# Patient Record
Sex: Male | Born: 1935 | Race: Black or African American | Hispanic: No | Smoking: Never smoker
Health system: Southern US, Community
[De-identification: ages and names within clinical notes are randomized; demographics above are authoritative.]

## PROBLEM LIST (undated history)

## (undated) DIAGNOSIS — H9190 Unspecified hearing loss, unspecified ear: Secondary | ICD-10-CM

## (undated) DIAGNOSIS — D509 Iron deficiency anemia, unspecified: Secondary | ICD-10-CM

## (undated) DIAGNOSIS — H5440 Blindness, one eye, unspecified eye: Secondary | ICD-10-CM

## (undated) DIAGNOSIS — H547 Unspecified visual loss: Secondary | ICD-10-CM

## (undated) HISTORY — PX: APPENDECTOMY: SHX54

## (undated) HISTORY — DX: Unspecified visual loss: H54.7

## (undated) HISTORY — PX: CATARACT EXTRACTION: SUR2

## (undated) HISTORY — DX: Iron deficiency anemia, unspecified: D50.9

## (undated) HISTORY — DX: Blindness, one eye, unspecified eye: H54.40

---

## 2001-02-16 ENCOUNTER — Encounter: Payer: Self-pay | Admitting: Urology

## 2001-02-17 ENCOUNTER — Inpatient Hospital Stay (HOSPITAL_COMMUNITY): Admission: RE | Admit: 2001-02-17 | Discharge: 2001-02-19 | Payer: Self-pay | Admitting: Urology

## 2001-02-17 ENCOUNTER — Encounter (INDEPENDENT_AMBULATORY_CARE_PROVIDER_SITE_OTHER): Payer: Self-pay

## 2002-02-05 ENCOUNTER — Encounter: Payer: Self-pay | Admitting: *Deleted

## 2002-02-05 ENCOUNTER — Inpatient Hospital Stay (HOSPITAL_COMMUNITY): Admission: EM | Admit: 2002-02-05 | Discharge: 2002-02-11 | Payer: Self-pay | Admitting: *Deleted

## 2002-02-05 ENCOUNTER — Encounter: Payer: Self-pay | Admitting: Internal Medicine

## 2002-02-07 ENCOUNTER — Encounter: Payer: Self-pay | Admitting: Internal Medicine

## 2002-02-10 ENCOUNTER — Encounter: Payer: Self-pay | Admitting: Internal Medicine

## 2003-10-31 ENCOUNTER — Ambulatory Visit (HOSPITAL_COMMUNITY): Admission: RE | Admit: 2003-10-31 | Discharge: 2003-10-31 | Payer: Self-pay | Admitting: Internal Medicine

## 2004-01-31 ENCOUNTER — Ambulatory Visit: Payer: Self-pay | Admitting: Internal Medicine

## 2004-01-31 ENCOUNTER — Inpatient Hospital Stay (HOSPITAL_COMMUNITY): Admission: EM | Admit: 2004-01-31 | Discharge: 2004-02-04 | Payer: Self-pay | Admitting: Emergency Medicine

## 2004-03-16 ENCOUNTER — Ambulatory Visit: Payer: Self-pay | Admitting: Internal Medicine

## 2005-05-27 ENCOUNTER — Ambulatory Visit: Payer: Self-pay | Admitting: Internal Medicine

## 2005-05-27 ENCOUNTER — Inpatient Hospital Stay (HOSPITAL_COMMUNITY): Admission: EM | Admit: 2005-05-27 | Discharge: 2005-06-05 | Payer: Self-pay | Admitting: Emergency Medicine

## 2005-06-17 ENCOUNTER — Ambulatory Visit: Payer: Self-pay | Admitting: Internal Medicine

## 2005-07-05 ENCOUNTER — Ambulatory Visit: Payer: Self-pay | Admitting: Internal Medicine

## 2005-07-29 ENCOUNTER — Encounter: Payer: Self-pay | Admitting: Vascular Surgery

## 2005-07-29 ENCOUNTER — Ambulatory Visit: Payer: Self-pay | Admitting: Endocrinology

## 2005-07-29 ENCOUNTER — Ambulatory Visit (HOSPITAL_COMMUNITY): Admission: RE | Admit: 2005-07-29 | Discharge: 2005-07-29 | Payer: Self-pay | Admitting: Endocrinology

## 2005-08-01 ENCOUNTER — Ambulatory Visit: Payer: Self-pay | Admitting: Endocrinology

## 2005-08-06 ENCOUNTER — Ambulatory Visit (HOSPITAL_COMMUNITY): Admission: RE | Admit: 2005-08-06 | Discharge: 2005-08-06 | Payer: Self-pay | Admitting: Obstetrics and Gynecology

## 2005-08-06 ENCOUNTER — Ambulatory Visit: Payer: Self-pay | Admitting: Internal Medicine

## 2005-09-10 ENCOUNTER — Ambulatory Visit: Payer: Self-pay | Admitting: Internal Medicine

## 2005-10-28 ENCOUNTER — Ambulatory Visit: Payer: Self-pay | Admitting: Internal Medicine

## 2006-03-10 ENCOUNTER — Emergency Department (HOSPITAL_COMMUNITY): Admission: EM | Admit: 2006-03-10 | Discharge: 2006-03-11 | Payer: Self-pay | Admitting: Emergency Medicine

## 2006-11-26 ENCOUNTER — Encounter: Payer: Self-pay | Admitting: Endocrinology

## 2006-11-26 DIAGNOSIS — I10 Essential (primary) hypertension: Secondary | ICD-10-CM | POA: Insufficient documentation

## 2006-12-24 IMAGING — CR DG ABDOMEN 2V
2 series · 2 of 2 positions shown · non-contrast
Comparison: none

CLINICAL DATA: Abdominal pain

[view not recorded (1 of 2)]
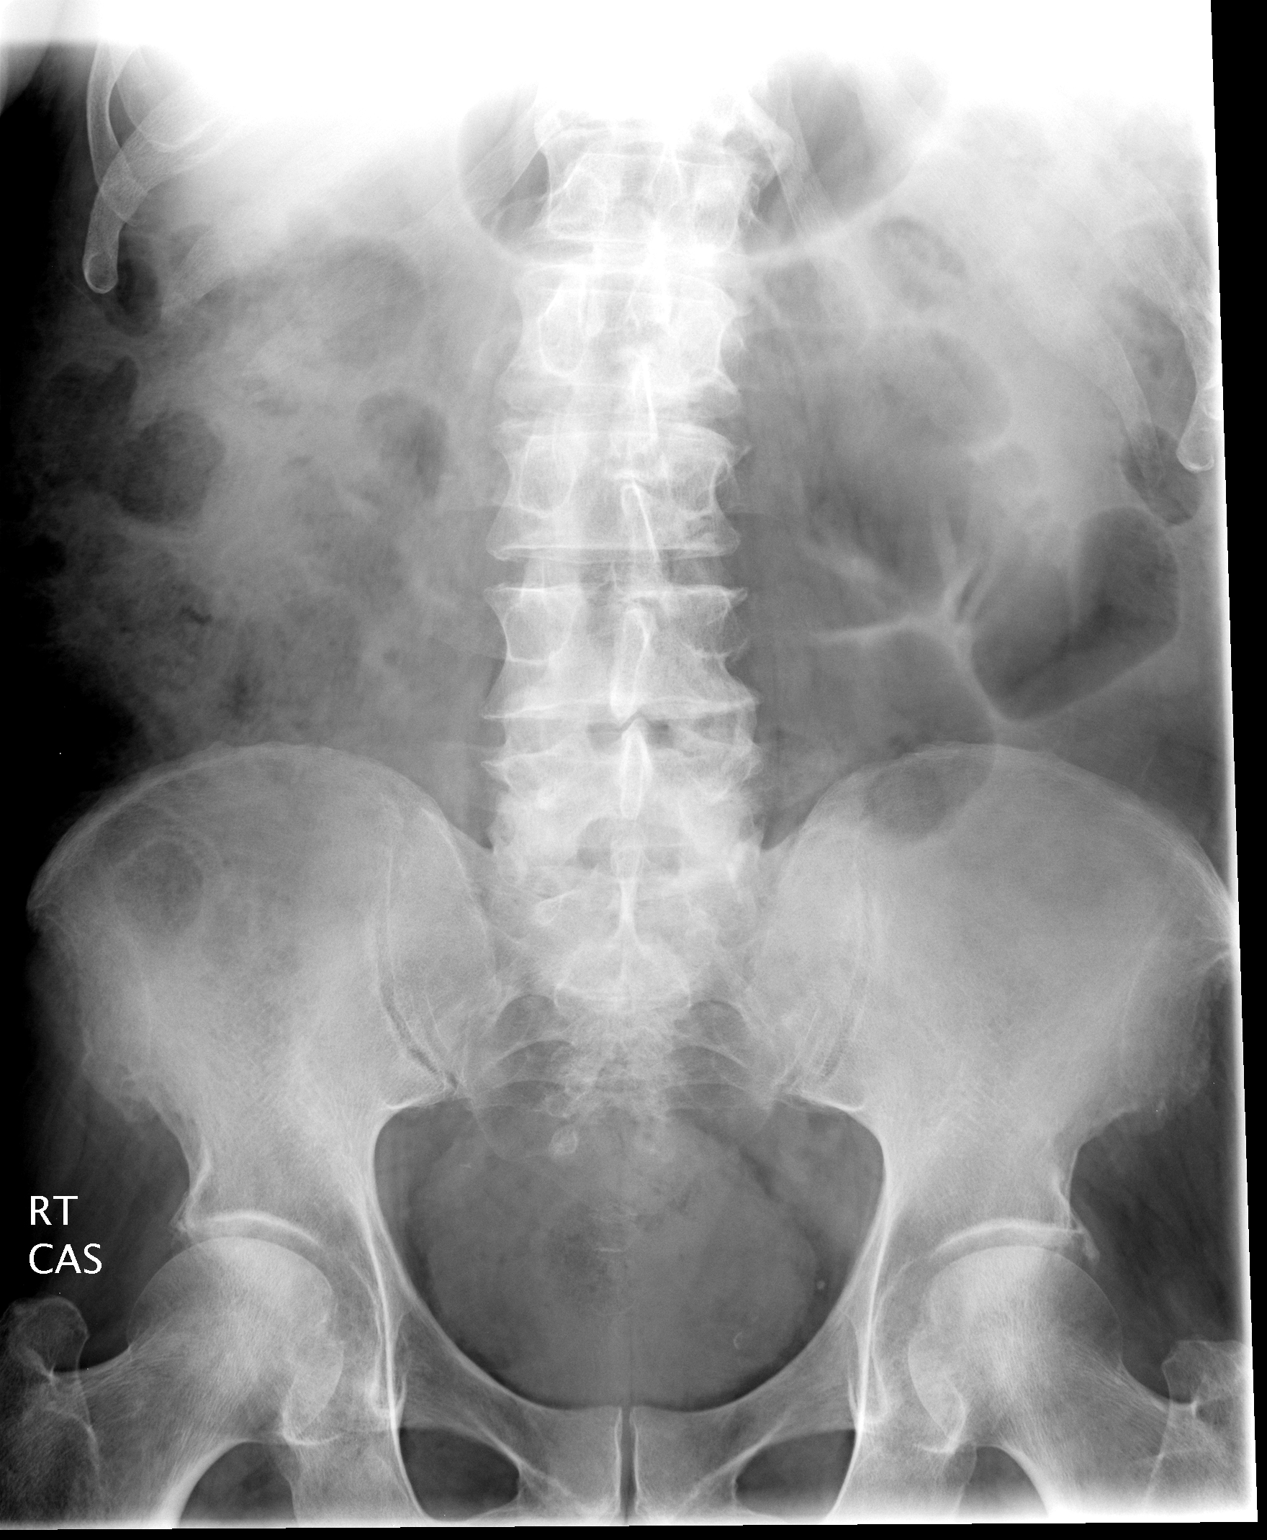

[view not recorded (2 of 2)]
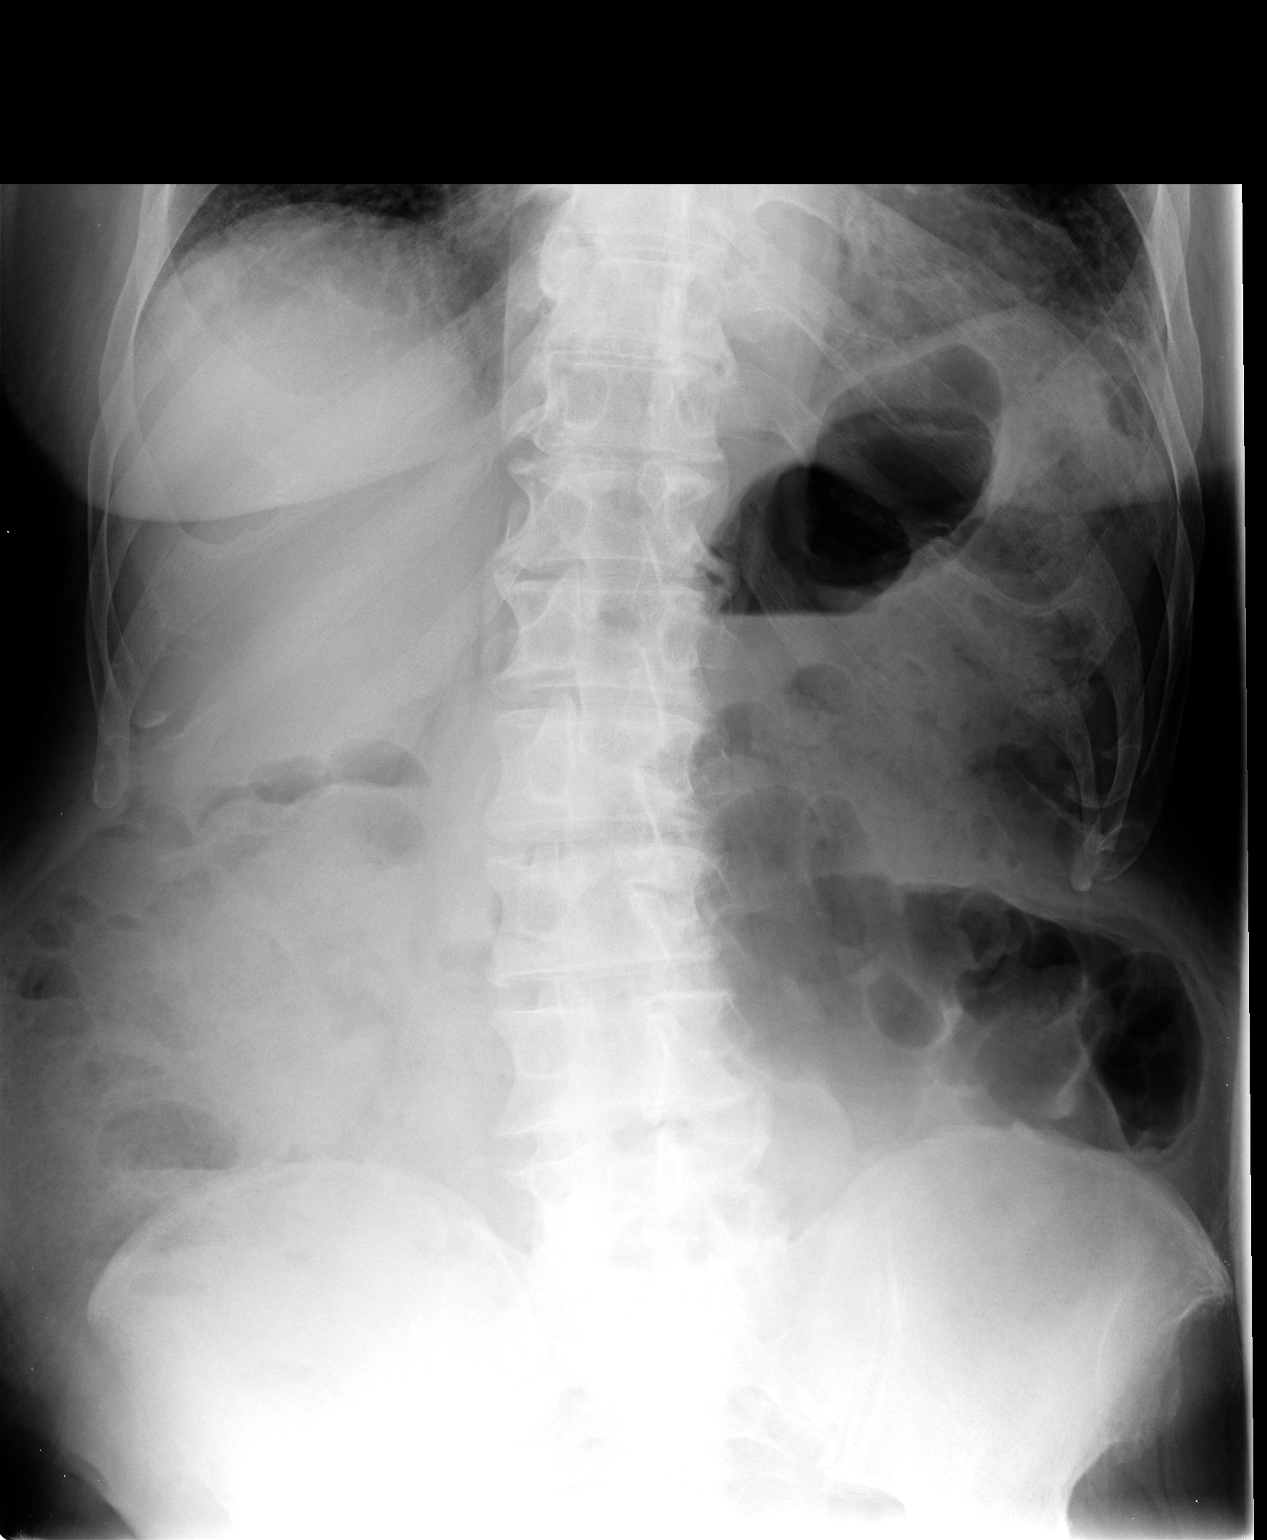

[2 of 2 positions shown; findings below may reference images not displayed]

Abdomen 2 view:

No free air. Small bowel decompressed. Moderate amount of fecal material in the
proximal colon which is nondilated. Degenerative spurring and mild
dextroscoliosis in the lumbar spine stable since films of 05/27/2005. Phleboliths
in the lower left pelvis. Degenerative spurring in both hips.
IMPRESSION: 1. Nonobstructed bowel gas pattern with moderate amount of fecal material in the
proximal colon.
2. No free air

## 2007-02-28 IMAGING — NM NM BONE 3 PHASE
1 series · 6 of 6 positions shown · non-contrast
Comparison: none

CLINICAL DATA: Left foot pain, diabetes mellitus. Thought to have osteomyelitis.
 NUCLEAR MEDICINE 3-PHASE BONE SCAN:
TECHNIQUE: Radionuclide angiographic, immediate static, and 3-hour delayed images were obtained after intravenous injection of radiopharmaceutical.
 Radiopharmaceutical:  20.9 mCi 5c-99m MDP.

[Series 1: fl flow and static · 8.46mm/px · 6 of 40 frames shown]
[frame 4/40]
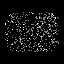
[frame 10/40]
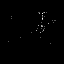
[frame 17/40]
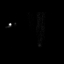
[frame 24/40]
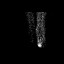
[frame 30/40]
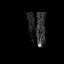
[frame 37/40]
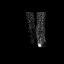

[6 of 6 positions shown; findings below may reference images not displayed]

FINDINGS: After the intravenous injection of 20.9 millicuries of technetium 44m-labeled MDP, rapid sequence flow study showed immediate increase in activity associated with the left lower leg and foot as compared to the right.  There is also intense activity seen immediately in the region of the left great toe.  
 Intermediate and delayed studies were made and again showed the intense activity associated with the left first toe and mild diffuse increased activity associated with the left lower leg and foot as compared to the right.  No significant increased activity is seen focally in the region of the first cuneiform where there was some concern on the x-ray for bony destruction or demineralization.  The bone detail on the delayed studies is not optimal presumably due to the patient?s lack of hydration and ambulation.
IMPRESSION: Probable osteomyelitis changes associated with the distal phalangeal area of the left first toe.  No significant additional activity is seen in the region of the first cuneiform bone of the left foot.
 Bony detail is poor on this study due to the patient?s circulatory problems, lack of ambulation, and poor hydration during the study.

## 2007-05-31 ENCOUNTER — Emergency Department (HOSPITAL_COMMUNITY): Admission: EM | Admit: 2007-05-31 | Discharge: 2007-05-31 | Payer: Self-pay | Admitting: Emergency Medicine

## 2008-01-04 ENCOUNTER — Ambulatory Visit: Payer: Self-pay | Admitting: Internal Medicine

## 2008-01-04 ENCOUNTER — Observation Stay (HOSPITAL_COMMUNITY): Admission: EM | Admit: 2008-01-04 | Discharge: 2008-01-08 | Payer: Self-pay | Admitting: Emergency Medicine

## 2008-01-04 ENCOUNTER — Ambulatory Visit: Payer: Self-pay | Admitting: Cardiovascular Disease

## 2008-01-05 ENCOUNTER — Encounter: Payer: Self-pay | Admitting: Internal Medicine

## 2008-01-15 ENCOUNTER — Ambulatory Visit: Payer: Self-pay | Admitting: Internal Medicine

## 2008-01-15 DIAGNOSIS — D509 Iron deficiency anemia, unspecified: Secondary | ICD-10-CM

## 2008-01-15 DIAGNOSIS — H409 Unspecified glaucoma: Secondary | ICD-10-CM | POA: Insufficient documentation

## 2008-01-15 DIAGNOSIS — H5440 Blindness, one eye, unspecified eye: Secondary | ICD-10-CM

## 2008-01-15 DIAGNOSIS — M79609 Pain in unspecified limb: Secondary | ICD-10-CM | POA: Insufficient documentation

## 2008-01-15 DIAGNOSIS — H547 Unspecified visual loss: Secondary | ICD-10-CM

## 2008-01-15 DIAGNOSIS — N390 Urinary tract infection, site not specified: Secondary | ICD-10-CM

## 2008-01-22 ENCOUNTER — Telehealth: Payer: Self-pay | Admitting: Internal Medicine

## 2008-01-26 ENCOUNTER — Encounter: Payer: Self-pay | Admitting: Internal Medicine

## 2008-01-28 ENCOUNTER — Encounter: Payer: Self-pay | Admitting: Internal Medicine

## 2008-02-11 ENCOUNTER — Encounter: Payer: Self-pay | Admitting: Internal Medicine

## 2008-02-22 ENCOUNTER — Ambulatory Visit: Payer: Self-pay | Admitting: Internal Medicine

## 2008-03-01 ENCOUNTER — Encounter: Payer: Self-pay | Admitting: Internal Medicine

## 2009-02-14 ENCOUNTER — Ambulatory Visit: Payer: Self-pay | Admitting: Internal Medicine

## 2009-02-14 DIAGNOSIS — R21 Rash and other nonspecific skin eruption: Secondary | ICD-10-CM

## 2009-12-14 ENCOUNTER — Ambulatory Visit: Payer: Self-pay | Admitting: Internal Medicine

## 2010-01-02 ENCOUNTER — Telehealth: Payer: Self-pay | Admitting: Internal Medicine

## 2010-03-09 ENCOUNTER — Telehealth: Payer: Self-pay | Admitting: Internal Medicine

## 2010-03-12 ENCOUNTER — Encounter: Payer: Self-pay | Admitting: Internal Medicine

## 2010-04-26 NOTE — Assessment & Plan Note (Signed)
Summary: LEG PAIN /NWS   Vital Signs:  Patient profile:   77 year old male Height:      79 inches Weight:      163 pounds BMI:     18.43 O2 Sat:      98 % on Room air Temp:     97.9 degrees F oral Pulse rate:   64 / minute BP sitting:   132 / 84  (left arm) Cuff size:   regular  Vitals Entered By: Bill Salinas CMA (December 14, 2009 9:07 AM)  O2 Flow:  Room air CC: pt c/o leg pain (thigh area) x 1 month/ ab   Primary Care Provider:  Jacques Navy MD  CC:  pt c/o leg pain (thigh area) x 1 month/ ab.  History of Present Illness: [name] presents c/o pain in the dorsal aspect proximal left LE to touch. He states tha tif he touches his leg or holds the leg it will give him a sharp pain. He has some pain in the groin. He can stand, walk, laydown witout pain. He has no focal weakness or pareesthesia. He denies any injury. He has taken no medications for this.  Current Medications (verified): 1)  Xalatan 0.005 %  Soln (Latanoprost) .... Use 1 Drop in Each Eye Qd 2)  Timoptic Ocudose 0.5 %  Soln (Timolol Maleate) .... Use 1 Drop in Each Eye Qd 3)  Ferrous Sulfate 325 (65 Fe) Mg Tabs (Ferrous Sulfate) .... Take 1 Tablet By Mouth Two Times A Day 4)  Tylenol 325 Mg Tabs (Acetaminophen) .... As Needed 5)  Clotrimazole-Betamethasone 1-0.05 % Crea (Clotrimazole-Betamethasone) .... Apply To Affected Area On Back Two Times A Day.  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  Physical Exam  General:  elderly AA male in no distress Head:  normocephalic and atraumatic.  Ptosis right eye. Eyes:  C&S clear Neck:  supple.   Lungs:  normal respiratory effort and normal breath sounds.   Heart:  normal rate and regular rhythm.   Msk:  Left leg without deformity. No swelling at the knee. No mass left thigh. Tender to palpation along the quadraceps and c/o of sharp pain with palpation. No pain with flexion of the hip. Pulses:  2+ radial pulse Neurologic:  alert & oriented X3. Hyperesthesia proximal left LE.  Skin:  turgor normal and color normal.   Inguinal Nodes:  no L inguinal adenopathy.   Psych:  Oriented X3, memory intact for recent and remote, and normally interactive.     Impression & Recommendations:  Problem # 1:  LEG PAIN, LEFT (ICD-729.5) Patient's pain by character, location suggests superficial femoral nerve radiculopathy. He has no limitation in movement.  Plan - use of lineament, e.g. aspercreme           otc NSAID           if persistent discomfort will Rx gabapentin and a course of PT/massage therapy  Problem #  2:  HYPERTENSION (ICD-401.9)  BP today: 132/84 Prior BP: 138/62 (02/14/2009)  Normal BP without benefit of medication.  Complete Medication List: 1)  Xalatan 0.005 % Soln (Latanoprost) .... Use 1 drop in each eye qd 2)  Timoptic Ocudose 0.5 % Soln (Timolol maleate) .... Use 1 drop in each eye qd 3)  Ferrous Sulfate 325 (65 Fe) Mg Tabs (Ferrous sulfate) .... Take 1 tablet by mouth two times a day 4)  Tylenol 325 Mg Tabs (Acetaminophen) .... As needed 5)  Clotrimazole-betamethasone 1-0.05 % Crea (Clotrimazole-betamethasone) .... Apply to affected area on back two times a day.

## 2010-04-26 NOTE — Progress Notes (Signed)
Summary: LETTER NEEDED ASAP  Phone Note Call from Patient   Caller: [name] - [phone] Summary of Call: [name] is req a call regarding [facility] forms.  Initial call taken by: Lamar Sprinkles, CMA,  March 09, 2010 10:06 AM  Follow-up for Phone Call        Spoke w/Donna. She needs a letter from MD stating that pt does not have dx of memory loss or dementia.  Follow-up by: Lamar Sprinkles, CMA,  March 09, 2010 11:32 AM  Additional Follow-up for Phone Call Additional follow up Details #1::        Daughter called again, she has a time limit & wants to know status of letter.  Additional Follow-up by: Lamar Sprinkles, CMA,  March 12, 2010 12:35 PM    Additional Follow-up for Phone Call Additional follow up Details #2::    letter done. Follow-up by: Jacques Navy MD,  March 12, 2010 6:13 PM   Appended Document: LETTER NEEDED ASAP Pt's dtr informed to pick up letter

## 2010-04-26 NOTE — Progress Notes (Signed)
  Phone Note Call from Patient   Caller: [name] - Daughter [phone] Summary of Call: Pt's daughter is req a call back regarding [facility] paperwork.  Initial call taken by: Lamar Sprinkles, CMA,  January 02, 2010 11:36 AM  Follow-up for Phone Call        Spoke w/daughter, she wanted to ensure that we recieved the paperwork from [place]. Assured her that this was completed. She has concerns that MD does not know what kind of care patient really needs. I advised her that after [facility] makes decision and she feels pt should have more help to call our office - we will then attempt to do more depending on pt's needs.  Follow-up by: Lamar Sprinkles, CMA,  January 03, 2010 10:18 AM

## 2010-04-26 NOTE — Letter (Signed)
Summary: Generic Letter  [facility]  [address] [place], [place] [address]   Phone: [phone]  Fax: [phone]           03/12/2010  DURAN OHERN [address] [place], [place]  [address]  Dear Mr. CYGAN,  In reviewing you medical record and my office notes going back to 2009 I find no evidence of memory loss or mental incapacity of any kind. There has not been cause for doing a in-depth assessment in this area but no abnormal findings are noted in the record.  For any additional questions please feel free to contact me.   Sincerely,   Illene Regulus MD

## 2010-04-27 ENCOUNTER — Ambulatory Visit (INDEPENDENT_AMBULATORY_CARE_PROVIDER_SITE_OTHER): Payer: MEDICARE | Admitting: Internal Medicine

## 2010-04-27 ENCOUNTER — Encounter: Payer: Self-pay | Admitting: Internal Medicine

## 2010-04-27 ENCOUNTER — Telehealth: Payer: Self-pay | Admitting: Internal Medicine

## 2010-04-27 DIAGNOSIS — M26629 Arthralgia of temporomandibular joint, unspecified side: Secondary | ICD-10-CM

## 2010-04-27 DIAGNOSIS — J029 Acute pharyngitis, unspecified: Secondary | ICD-10-CM

## 2010-05-02 NOTE — Progress Notes (Signed)
Summary: OV TODAY  Phone Note Call from Patient   Caller: [name] - [phone] Summary of Call: Req apt for sore throat & swollen glands - Scheduled for office visit today Initial call taken by: Lamar Sprinkles, CMA,  April 27, 2010 10:27 AM

## 2010-05-10 NOTE — Assessment & Plan Note (Signed)
Summary: Sore throat and swollen gland/SD   Vital Signs:  Patient profile:   77 year old male Height:      79 inches Weight:      155 pounds BMI:     17.52 O2 Sat:      97 % on Room air Temp:     98.6 degrees F oral Pulse rate:   80 / minute BP sitting:   122 / 90  (left arm) Cuff size:   regular  Vitals Entered By: Bill Salinas CMA (April 27, 2010 4:33 PM)  O2 Flow:  Room air CC: pt here with c/o sore throat and pressure on left side of his face near his ear/ab   Primary Care Provider:  Jacques Navy MD  CC:  pt here with c/o sore throat and pressure on left side of his face near his ear/ab.  History of Present Illness: Mr. Ake present c/o severe pain in the left ear. He has had no drainage from the ear, no fever, no tinnitis, no increased loss of hearing. He also c/o sore throat more on the left than right. Again, no fever, no odynophagia. He has had no N/V. He is weak but this is chronic.  Current Medications (verified): 1)  Xalatan 0.005 %  Soln (Latanoprost) .... Use 1 Drop in Each Eye Qd 2)  Timoptic Ocudose 0.5 %  Soln (Timolol Maleate) .... Use 1 Drop in Each Eye Qd 3)  Ferrous Sulfate 325 (65 Fe) Mg Tabs (Ferrous Sulfate) .... Take 1 Tablet By Mouth Two Times A Day 4)  Tylenol 325 Mg Tabs (Acetaminophen) .... As Needed 5)  Clotrimazole-Betamethasone 1-0.05 % Crea (Clotrimazole-Betamethasone) .... Apply To Affected Area On Back Two Times A Day.  Allergies (verified): No Known Drug Allergies  Past History:  Past Medical History: Last updated: 01/15/2008 VISUAL ACUITY, DECREASED, LEFT EYE (ICD-369.9) BLINDNESS, RIGHT EYE (ICD-369.60) ANEMIA, IRON DEFICIENCY (ICD-280.9) GLAUCOMA (ICD-365.9)  Past Surgical History: Last updated: 01/15/2008 Appendectomy age 67 Cataract extraction: left '89, right 26 SH/Risk Factors reviewed for relevance  Review of Systems       The patient complains of vision loss, decreased hearing, muscle weakness, and difficulty  walking.  The patient denies anorexia, fever, weight loss, chest pain, syncope, dyspnea on exertion, peripheral edema, abdominal pain, and severe indigestion/heartburn.    Physical Exam  General:  Elderly AA man who requires 1+ assist to get ot exam table and to walk - poor eyesight and balance Head:  Very tender at the left TMJ. Not tender at the right TMJ Ears:  left EAC and TM appear normal Mouth:  posterior phaarynx is erythematous and there appears to be some swelling.  Neck:  supple.   Lungs:  normal respiratory effort and normal breath sounds.   Heart:  normal rate and regular rhythm.   Neurologic:  alert & oriented X3.  Very unsteady on his feet.  Skin:  turgor normal, color normal, and no ulcerations.   Cervical Nodes:  no anterior cervical adenopathy and no posterior cervical adenopathy.   Psych:  Oriented X3, memory intact for recent and remote, normally interactive, and good eye contact.     Impression & Recommendations:  Problem # 1:  TEMPOROMANDIBULAR JOINT PAIN (ICD-524.62) Ear pain is from TMJ  Plan - aspercreme to external aspect of the joint           aleve bit - gave patient GI precautions.   Problem # 2:  PHARYNGITIS (ICD-462) Pharyngits that looks bacterial  Plan - amoxicillin three times a day for 7 days.  His updated medication list for this problem includes:    Tylenol 325 Mg Tabs (Acetaminophen) .Marland Kitchen... As needed    Amoxicillin 250 Mg/54ml Susr (Amoxicillin) .Marland KitchenMarland KitchenMarland KitchenMarland Kitchen 10 cc by mouth three times a day for pharyngitis (sore throat)(  Complete Medication List: 1)  Xalatan 0.005 % Soln (Latanoprost) .... Use 1 drop in each eye qd 2)  Timoptic Ocudose 0.5 % Soln (Timolol maleate) .... Use 1 drop in each eye qd 3)  Ferrous Sulfate 325 (65 Fe) Mg Tabs (Ferrous sulfate) .... Take 1 tablet by mouth two times a day 4)  Tylenol 325 Mg Tabs (Acetaminophen) .... As needed 5)  Clotrimazole-betamethasone 1-0.05 % Crea (Clotrimazole-betamethasone) .... Apply to affected area  on back two times a day. 6)  Amoxicillin 250 Mg/58ml Susr (Amoxicillin) .Marland Kitchen.. 10 cc by mouth three times a day for pharyngitis (sore throat)(  Patient Instructions: 1)  Pharyngitis - sore throat that looks bacterial. Plan - amoxicillin  10 cc's three times a day x 7 days. Gargle of choice 2)  Jaw joint pain - TMJ - use aspercreme on the face over the jaw. Take 2 aleve twice a day for 7 days to help with the pain. Watch for any stomach irritation and stop the aleve if you have discomfort or any change in bowels.  Prescriptions: AMOXICILLIN 250 MG/5ML SUSR (AMOXICILLIN) 10 cc by mouth three times a day for pharyngitis (sore throat)(  #250cc x 0   Entered and Authorized by:   Jacques Navy MD   Signed by:   Lamar Sprinkles, CMA on 04/27/2010   Method used:   Electronically to        [facility]  [address] [phone]* (retail)       [address]       [place], [place]  [id number]       Ph: [phone] or [phone]       Fax: [phone]   RxID:   [id number]    Orders Added: 1)  Est. Patient Level III [25956]

## 2010-07-05 ENCOUNTER — Ambulatory Visit: Payer: MEDICARE | Admitting: Internal Medicine

## 2010-08-07 NOTE — H&P (Signed)
[name], [name]                ACCOUNT NO.:  [id number]   MEDICAL RECORD NO.:  [id number]          PATIENT TYPE:  INP   LOCATION:  5148                         FACILITY:  [facility]   PHYSICIAN:  [name], MDDATE OF BIRTH:  24-Nov-1922   DATE OF ADMISSION:  01/04/2008  DATE OF DISCHARGE:                              HISTORY & PHYSICAL   PRIMARY CARE [name]:  [name], M.D.   CHIEF COMPLAINT:  Frequent falls.   HISTORY OF THE PRESENT ILLNESS:  The patient is an 77 year old gentleman  with a past medical history of glaucoma and debility who lives at home.  He is actually, per his family, fairly active, goes to church.  He does  have trouble hearing, but otherwise does okay.  He has had frequent  episodes of falls in the past usually associated with some kind of  illness such as pneumonia or urinary tract infection.  Today he comes in  again because he has suffered 3-4 falls today.  He is UA is positive for  a urinary tract infection again.  At that point [name] was  called to admit for [name].   Per discussion with his family they are very concerned that his falls  are secondary to some kind of malignancy; and, they state he has a  family history of bone cancer and they think that the falls could be  because of bone cancer.  I explained to them that at this point we have  no reason to believe so, but we will image his back since he has some  low-back pain and try to do some generalized screening  studies.  The  family feels strongly about having an oncologist see the patient at  which point I explained to them that that will be  difficult to pursue  unless he actually has an oncological problem, which we do not know at  this point; but, we will try to investigate this as fast as we can.  Per  the family the patient has not lost any weight.  He does have lower  extremity edema, which is just around  the ankle.  He does complain of  occasional joint  pains and occasional sharp pains in his eyes, which are  bilateral.  The patient is unable to provide much of a history himself  secondary to difficulty understanding and difficulty hearing.   REVIEW OF SYSTEMS:  Per the family he has no fevers, no chills and no  chest pain.  The patient does stay constipated and has not had a bowel  movement for couple of days now.  Otherwise the review of systems is  unremarkable.   PAST MEDICAL HISTORY:  The past medical history significant for glaucoma  and frequent falls.   ALLERGIES:  No known drug allergies.   MEDICATIONS:  The patient takes eye drops, Xalatan drops; otherwise, no  other meds.   PHYSICAL EXAMINATION:  VITAL SIGNS: Temperature 99.1, blood pressure  124/63, respirations 14, heart rate in the 60s, but goes up to the 80s  when the patient sits up, and O2  sat 99% on room air.  GENERAL APPEARANCE:  On physical exam the patient is in no acute  distress, lying down in bed and is nontoxic-appearing.  HEAD:  The head is atraumatic, but is somewhat misshaped.  NEUROLOGIC EXAMINATION:  Cranial nerves II-XII are intact.  Strength is  5/5 in all four extremities.  NECK:  The neck is supple.  No lymphadenopathy noted.  LUNGS:  The lungs are clear to auscultation bilaterally.  HEART:  The heart has a regular rate and rhythm.  No murmurs, rubs or  gallops.  ABDOMEN:  The abdomen is soft, nontender and nondistended.  EXTREMITIES:  Lower extremities show trace edema around the ankles,  otherwise unremarkable.   LABORATORY DATA:  White blood cell count 18.9, hemoglobin 11.7 and  platelets 128,000.  Potassium 4.1, sodium 141 and creatinine 1.5.  Albumin 3.2.  Calcium 9.1.  Lipase 22.  BNP 150.  UA showed numerous  white blood cells, positive leukocyte esterase, but no nitrites.   Chest x-ray showed no acute cardiopulmonary disease.  A CT scan of the  head is also unremarkable.  EKG showed T-wave inversion lead 3, V5 and  V6, otherwise it is  unremarkable with no evidence of acute ischemia or  infarction.   ASSESSMENT/PLAN:  This is an 77 year old gentleman with a history of  deconditioning in the past who presents with frequent falls and a  urinary tract infection.  1. Urinary tract infection.  We will treat with Rocephin and follow      urine culture.  2. Frequent falls.  I think that the current falls are possibly      related to the urinary tract infection.  I did explain this to the      family, but given the patient has had a lot of back pain we will      obtain lumbar spine films.  Currently there are no neurological      localizing symptoms, but if this persists we may consider magnetic      resonance imaging of the brain if is possible to rule out a      neurological disorder.  We will also obtain an a.m. sedimentation      rate and LDH.  The sedimentation rate, I am afraid, will probably      be elevated secondary to the urinary tract infection; but, if it is      elevated very severely further investigation could be initiated.  3. Deconditioning.  We will arrange for physical therapy and      occupational therapy evaluation.  4. Prophylaxes.  Lovenox and Protonix.      [name], MD  Electronically Signed     AVD/MEDQ  D:  01/04/2008  T:  01/05/2008  Job:  [id number]   cc:   Paul Gess. Norins, MD

## 2010-08-07 NOTE — Discharge Summary (Signed)
[name], [name]                ACCOUNT NO.:  [id number]   MEDICAL RECORD NO.:  [id number]          PATIENT TYPE:  OBV   LOCATION:  5148                         FACILITY:  [facility]   PHYSICIAN:  Rosalyn Gess. Norins, MD  DATE OF BIRTH:  [birth year]   DATE OF ADMISSION:  01/04/2008  DATE OF DISCHARGE:  01/08/2008                               DISCHARGE SUMMARY   ADMITTING DIAGNOSES:  1. Falls.  2. Urinary tract infection.  3. Thigh pain.  4. Borderline hypothyroid disease.  5. Glaucoma with significant loss of vision.   DISCHARGE DIAGNOSES:  1. Urinary tract infection with Klebsiella pneumoniae, sensitive to      ceftriaxone.  2. Falls with no evidence of any pathologic fracture or injury.  3. Borderline hypothyroid disease, stable.  4. Glaucoma.   CONSULTANTS:  None.   PROCEDURES:  1. Chest x-ray on admission which revealed cardiomegaly with no other      acute abnormality.  2. CT scan of the head with contrast which revealed mild-to-moderate      atrophy and mild chronic small-vessel white matter ischemic changes      in both cerebral hemispheres with no acute abnormality.  3. Acute abdominal x-ray on January 04, 2008, which showed no findings      of bowel obstruction.  4. Lumbar spine series performed on January 05, 2008, which showed      lumbar spondylosis without fracture or acute lumbar spine findings.  5. Followup chest x-ray on January 06, 2008, showed cardiomegaly with      vascular congestion.  6. Whole body bone scan performed on January 07, 2008, which showed      minimal activity with the probability being arthritic with no      pattern seem to suggest metastatic bone disease.   HISTORY OF PRESENT ILLNESS:  [name] is an elderly patient followed  by [name], although not seen in the office for several years, who  presented to the emergency department because of difficulty with balance  and falling.  In the past, this had been associated with acute  infectious process.  He presented to the emergency department because of  recurrent falls and did have a positive UA on admission and was admitted  for treatment.   The patient's family was very concerned that his falls were secondary to  malignancy and stated there was a family history of bone cancer.  They  wanted Oncology consult, but there was no evidence of acute oncologic  disease.   Please see H and P for past medical history, family history, social  history, and physical exam.   HOSPITAL COURSE:  1. UTI.  The patient was started on Rocephin.  Urinalysis was      performed, revealing a positive UA and cultures were sent which      returned as Klebsiella, sensitive to ceftriaxone.  The patient did      well with improvement in his overall state of well being.  He was      switched to Ceftin on January 07, 2008.  He continued to do well  with only minimal low-grade fever and was thought to be stable to      complete antibiotic therapy at home.  2. Falls.  The patient was seen by PT and OT.  It was recommended that      he have 24-hour supervision.  He also was thought to benefit from      [facility], OT and PT.  Arrangements had been made with advanced      home care for him to have this service.  They have worked with the      patient in the past.  He does have a walker at home already.  3. Thyroid disease.  The patient did have a TSH performed on this      admission which returned as 0.338 which is in normal limits.  This      was actually repeated on January 05, 2008, and returned as 0.426,      again within normal limits.  No further evaluation was indicated at      this time.  4. Falls.  The patient does have a positive family history for bone      cancer in his father.  The patient has had no oncologic diagnosis      in the past that I could determine.  Laboratory did reveal a PSA      that was elevated at 17.04.  Bone scan was performed as noted with      no  evidence of metastatic disease.  Given the patient's advanced      age at 78, I will be conservative in following this problem.  We      will see him in the office as an outpatient.  At that time, we will      perform digital rectal exam.  If there is significant palpable      disease, we will refer him to Urology for further evaluation and      treatment.   With the patient being stable in regards to infectious process with  evaluation being completed in terms of bone survey and laboratory  evaluations, he is thought to be stable and ready for discharge home.   DISCHARGE EXAMINATION:  VITAL SIGNS:  Temperature was 99.8, blood  pressure 188/90, heart rate was 89, respirations were 20.  GENERAL APPEARANCE:  This is a pleasant, elderly African American  gentleman lying in bed in no acute distress.  He has a very poor vision,  but acute hearing and recognizes the voice of the examiner.  HEENT:  Unremarkable.  CHEST:  The patient was moving air well.  No rales, wheezes, or rhonchi  were appreciated.  CARDIOVASCULAR:  2+ radial pulse.  He had no JVD or carotid bruits.  His  precordium was quiet.  He had a regular rate and rhythm.  ABDOMEN:  Soft.  No further examination was conducted.   FINAL LABORATORY:  Urine culture, as noted, positive for Klebsiella  pneumoniae sensitive to ceftriaxone.  Blood cultures were no growth at  the time of discharge.  PSA was 17.04 as noted.  Final CBC on January 06, 2008, with a white count of 12,100, hemoglobin 10.8 g, hematocrit  31.9.  Sedimentation rate was performed, elevated at 30 consistent with  arthritic type changes.  Anemia panel was performed on January 05, 2008,  which showed an adequate, but low reticulocyte count.  The patient was  significantly iron deficient with iron at less than 10.  B12 was normal,  but low range at 292, folate was normal at 4.9, ferritin was elevated at  331.  The patient will be started on iron therapy.  Final TSH  from  January 05, 2008, as noted 0.426.  The patient did have cardiac enzymes  that were drawn.  Mildly elevated total CK of 319.  No MB fraction was  presented.  This may represent muscle inflammation and injury from his  falls.  INR was checked and was normal at 1.4.  The patient did have  other cardiac enzymes including negative troponin at 0.03.  Beta  natriuretic peptide was minimally elevated at 150.   DISCHARGE MEDICATIONS:  The patient will be started on ferrous sulfate  325 mg b.i.d.  He will be continued on stomach protection with an H2  blocker using ranitidine 150 mg b.i.d.  He will be continued on Ceftin  250 mg b.i.d. for an additional 5 days.  The patient may use Tylenol for  pain.  He will continue with his ophthalmologic medications.   DISPOSITION:  The patient is discharged home.  Arrangements have been  made for Home Health RN, PT and OT evaluations and treatment.   The patient will be seen in the office for routine followup in 7-10  days.   The patient's condition at the time of discharge dictation is stable and  improved.      Rosalyn Gess Norins, MD  Electronically Signed     MEN/MEDQ  D:  01/08/2008  T:  01/08/2008  Job:  [id number]

## 2010-08-10 NOTE — Op Note (Signed)
[facility]  Patient:    [name], [name] Visit Number: [id number] MRN: [id number]          Service Type: SUR Location: 1S X002 01 Attending Physician:  Evlyn Clines Dictated by:   Excell Seltzer. Annabell Howells, M.D. Admit Date:  02/17/2001   CC:         Rosalyn Gess. Norins, M.D. [facility]   Operative Report  PREOPERATIVE DIAGNOSIS:  Benign prostatic hypertrophy with retention.  POSTOPERATIVE DIAGNOSIS:  Benign prostatic hypertrophy with retention.  PROCEDURE:  Transurethral resection of the prostate and suprapubic tube insertion.  SURGEON:  Excell Seltzer. Annabell Howells, M.D.  ANESTHESIA:  General.  DRAINS:  A 22-French urethral Foley, a 20-French suprapubic tube.  SPECIMENS:  Prostate tissue.  COMPLICATIONS:  None.  INDICATIONS:  The patient is a 77 year old black male who presented with a several month history of irritative voiding symptoms.  He was found to have a palpably distended bladder with a volume of 1200 cc.  He has mild renal insufficiency and bilateral hydronephrosis.  Cystoscopy demonstrated an enlarged prostate with obstruction.  It was felt that TURP with insertion of suprapubic tube was indicated.  The suprapubic tube in case his bladder is injured from the chronic stretch injury to provide a safety valve for bladder drainage.  FINDINGS AND DESCRIPTION OF PROCEDURE:  The patient was given p.o. Tequin.  He was taken to the operating room where general anesthetic was induced.  He was placed in the lithotomy position.  His lower abdomen was shaved.  He was prepped with Betadine solution and draped in the usual sterile fashion.  A 28-French continuous flow resectoscope sheath was inserted without difficulty. This was fitted with an [place] handle, a 12 degree lens, and 26 loupe.  The cautery was set on blend 1.  Inspection revealed bilobar hyperplasia with high bladder neck.  The bladder itself had catheter irritation and multiple cellules and diverticula  with severe trabeculation.  No tumors were noted.  No stones were seen.  Ureteral orifices were well away from the bladder neck.  Prostatic resection was initiated by exposing the bladder neck fibers from 5 to 7 oclock.  The floor of the prostate was then resected out to and alongside the verumontanum on each side.  The left lobe of the prostate was then resected from the bladder neck to apex out to capsular fibers.  This was followed by the right lobe of the prostate.  At this point, chips were evacuated, and additional resection was performed on the anterior aspect of the prostate as well as some residual apical tissue.  Once an adequate resection had been performed and hemostasis was achieved, the bladder was evacuated free of chips.  The bladder was left full.  The resectoscope sheath was removed and a curved Lowsley retractor was placed per urethra.  The patient was placed in the Trendelenburg position.  The tip of the Lowsley was pressed against the anterior abdominal wall and a knife was used to incise down on the tip of the Lowsley which was then passed through the abdominal wall.  The Lowsley was opened.  The 20-French Foley catheter was grasped in the jaws and pulled back into the bladder where the balloon was inflated with 10 cc of sterile fluid.  The Lowsley was then disengaged and removed.  The resectoscope was repassed.  It showed good position of the suprapubic tube.  No retained chips were noted.  No active bleeding was noted. The external sphincter and ureteral orifices  were intact.  The resectoscope sheath was then once again removed, and a 22-French Foley catheter was inserted.  With the aid of the catheter guide, the balloon was filled with 30 cc of fluid.  The catheter was then held on traction and hand irrigated until clear.  The patient was then started on irrigation through the Foley catheter out from the suprapubic catheter which was connected to straight  drainage.  The patient was taken down from the lithotomy position.  His anesthetic was reversed.  He was moved to the recovery room in stable condition.  The specimen was sent to the lab for a microscopy.  There were no complications. Dictated by:   Excell Seltzer. Annabell Howells, M.D. Attending Physician:  Evlyn Clines DD:  02/17/01 TD:  02/17/01 Job: [id number]

## 2010-08-10 NOTE — Discharge Summary (Signed)
NAMECOULTER, OLDAKER NO.:  [id number]   MEDICAL RECORD NO.:  [id number]          PATIENT TYPE:  INP   LOCATION:  5114                         FACILITY:  [facility]   PHYSICIAN:  Rene Paci, M.D. LHCDATE OF BIRTH:  [birth year]   DATE OF ADMISSION:  01/31/2004  DATE OF DISCHARGE:  02/04/2004                                 DISCHARGE SUMMARY   DISCHARGE DIAGNOSES:  1.  Chest pain.  2.  Dyspnea.  3.  Right upper lobe pneumonia.  4.  Ataxia.   BRIEF ADMISSION HISTORY:  Mr. Hommel is a 77 year old African American male  who presented with cold-like symptoms x1 week.  He describes increased  productive cough and weakness.  He also notes increased congestion.   PAST MEDICAL HISTORY:  1.  Hypertension.  2.  Glaucoma.  3.  Status post  bilateral cataract repair with right vision loss.   HOSPITAL COURSE:  PROBLEM #1 -  INFECTIOUS DISEASE:  The patient presented  with low grade fever and leukocytosis.  Chest x-ray was consistent with  right middle lobe, upper lobe pneumonia.  The patient was started on  Rocephin and Zithromax.  A CT of his chest was obtained confirming an air  space infiltrate in the right upper lobe.  The patient's fever defervesced.  His white count normalized.  He was changed to oral antibiotics to complete  a full 10-day course.   PROBLEM #2 -  PULMONARY:  As noted, the patient had CT of the chest.  It did  reveal an abnormal right upper lobe, could not rule out infiltrate versus  developing neoplasm.  There was also a subcentimeter nodule in the right  middle lobe.  It was recommended that he have CT follow-up in two to three  months.   PROBLEM #3 -  NEUROLOGIC:  The patient has been having some ataxia as an  outpatient.  MRA of the brain was obtained on February 01, 2004.  This has  been completed but still we do not have a transcribed report.  The patient  was seen by both physical therapy and occupational therapy who made  recommendations  for home health follow-up.  This will be arranged.   LABORATORY DATA AT DISCHARGE:  Hemoglobin 12.6.  CMET was normal.  LFTs were  normal.  Urinalysis revealed moderate amount of leukocyte esterase, positive  nitrates, 3 to 6 wbc and few bacteria.  No culture was obtained.   DISCHARGE MEDICATIONS:  1.  He is to resume his home eye drops including Alphagan and Xalatan.  2.  He is on Ceftin 250 mg b.i.d.  Last dose is February 08, 2004.   FOLLOW UP:  The patient will follow up with Dr. Debby Bud Monday, February 20, 2004, at 2:15 p.m.      Laur   LC/MEDQ  D:  02/03/2004  T:  02/03/2004  Job:  [id number]   cc:   Rosalyn Gess. Norins, M.D. [facility]

## 2010-08-10 NOTE — Discharge Summary (Signed)
[name], [name]                ACCOUNT NO.:  [id number]   MEDICAL RECORD NO.:  [id number]          PATIENT TYPE:  INP   LOCATION:  5709                         FACILITY:  [facility]   PHYSICIAN:  Rene Paci, M.D. LHCDATE OF BIRTH:  Jul 11, 1922   DATE OF ADMISSION:  05/27/2005  DATE OF DISCHARGE:  06/05/2005                                 DISCHARGE SUMMARY   DISCHARGE DIAGNOSES:  1.  Fever, likely secondary to right-sided pneumonia.  2.  Nausea and vomiting, resolved.  3.  Low back pain, subacute, likely musculoskeletal.  4.  Sick euthyroid.   HISTORY OF PRESENT ILLNESS:  Patient is an 77 year old African-American male  who presented on May 27, 2005 to the emergency department with two-day  history of low back pain accompanied by difficulty walking.  Patient states  that he slid against the wall.  He was admitted for further evaluation.   PAST MEDICAL HISTORY:  1.  Hypertension.  2.  Glaucoma, legally blind in the right eye.   HOSPITAL COURSE:  #1 - LOW BACK PAIN, SUBACUTE:  The patient underwent an x-  ray of the lumbar spine which showed stable lumbar spondylosis and  scoliosis.  Did not show any acute fracture.  It was felt that patient's  back pain was likely secondary to musculoskeletal pain.  A PT evaluation and  OT evaluation was obtained.  It was recommended that patient have home  health.   #2 - RIGHT-SIDED PNEUMONIA:  The patient was treated with IV Rocephin and  Zithromax which was later changed to p.o. Avelox.  Patient's fever has  resolved.  He was noted to have some wheezing and has been given nebulizer  treatments while here in the hospital.  His O2 saturations remained stable  at 100% on room air.   #3 - SICK EUTHYROID:  The patient was noted to have a depressed TSH, but  normal free T3 and free T4, likely sick euthyroid.  He will need outpatient  follow-up.   #4 - NAUSEA AND VOMITING:  Patient did develop episode of nausea and  vomiting during this  hospitalization.  An abdominal ultrasound was performed  which showed cholelithiasis, but no biliary ductal dilatation.   DISCHARGE MEDICATIONS:  1.  Avelox 400 mg p.o. daily for three additional days.  2.  Xalatan 0.005% ophthalmic solution one drop to both eyes daily.  3.  Enteric-coated aspirin 81 mg p.o. daily.   DISCHARGE LABORATORIES:  B12 288.  Hemoglobin 11.2, hematocrit 33.1.  INR  1.1.   FOLLOW-UP:  The patient is scheduled to follow up with Dr. Illene Regulus on  March 26 at 10:00 a.m.  He is instructed to call Dr. Debby Bud should he  develop weakness, fever of 101, or shortness of breath.      Melissa S. Peggyann Juba, NP      Rene Paci, M.D. [facility]  Electronically Signed    MSO/MEDQ  D:  06/04/2005  T:  06/06/2005  Job:  [id number]   cc:   Rosalyn Gess. Norins, M.D. [facility]  [address]  [place]  [place] [address]

## 2010-08-10 NOTE — Discharge Summary (Signed)
[facility]  Patient:    RUSSEL, MORAIN Visit Number: [id number] MRN: [id number]          Service Type: SUR Location: 3W 0355 01 Attending Physician:  Evlyn Clines Dictated by:   Excell Seltzer. Annabell Howells, M.D. Admit Date:  02/17/2001 Discharge Date: 02/19/2001   CC:         Rosalyn Gess. Norins, M.D. [facility]   Discharge Summary  HISTORY OF PRESENT ILLNESS:  Mr. Mudry is a 77 year old black male sent in consultation by Rosalyn Gess. Norins, M.D., for irritative voiding symptoms and incontinence extending over several months.  He was found to have a distended bladder with urinary retention and bilateral hydronephrosis.  He has undergo Foley drainage and presents now for TURP.  There was some concern about the possibility of prostate cancer due to the presence of a nodule in the base of the prostate, but office biopsies were negative.  For additional details of the history and physical, please see the included office H&P.  ACCESSORY CLINICAL INFORMATION:  The preoperative hemoglobin was 11.1 and hematocrit 3.2.  Chemistries within normal limits with a BUN of 10 and a creatinine of 1.4.  HOSPITAL COURSE:  On the day of admission, the patient was taken to the operating room where a TURP was performed.  A suprapubic tube was inserted due to the patients chronic retention and concern for the probability of postoperative hypotonic bladder and difficulty voiding.  He tolerated the procedure well and was left with a Foley in addition to his suprapubic tube on continuous irrigation.  On the first postoperative day, his temperature maximum was 100.5 degrees and his urine was clear.  On the first postoperative day, his urine was clear and his Foley catheter was removed.  On the second postoperative day, he was voiding some, but his residuals were 400 cc.  He was felt to be ready for discharge home and was encouraged to void and then check the residual and keep a record of  that finding.  His final pathology revealed BPH.  DISCHARGE DIAGNOSES:  Benign prostatic hypertrophy with chronic urinary retention and a hypotonic bladder.  COMPLICATIONS:  There were no complications during his admission.  DISCHARGE MEDICATIONS: 1. Darvocet-N 100 one to two p.o. q.4-6h. p.r.n. pain. 2. Bactrim DS one p.o. b.i.d.  FOLLOW-UP:  He was instructed to follow up with me in one week for a recheck.  ACTIVITY:  Activity restrictions were explained.  DISPOSITION:  To home.  PROGNOSIS:  Good.  CONDITION ON DISCHARGE:  Improved. Dictated by:   Excell Seltzer. Annabell Howells, M.D. Attending Physician:  Evlyn Clines DD:  03/05/01 TD:  03/05/01 Job: [id number]

## 2010-08-10 NOTE — H&P (Signed)
NAME:  [name], [name]                          ACCOUNT NO.:  [id number]   MEDICAL RECORD NO.:  [id number]                   PATIENT TYPE:  EMS   LOCATION:  MAJO                                 FACILITY:  [facility]   PHYSICIAN:  Rosalyn Gess. Norins, M.D. [facility]         DATE OF BIRTH:  12-12-1922   DATE OF ADMISSION:  02/05/2002  DATE OF DISCHARGE:                                HISTORY & PHYSICAL   CHIEF COMPLAINT:  Weakness.   HISTORY OF PRESENT ILLNESS:  Mr. Duve is a 77 year old black male who was  in his usual state of health until this a.m., when he awoke with diffuse  generalized weakness, being unable to get up out of bed unassisted.  His  weakness persisted throughout the day and this evening EMS was called. They  found the patient too weak to ambulate on his own.  He was brought to [facility] Emergency Department for evaluation.  In the ER he continued to  complain of generalized weakness and was unable to stand or ambulate on his  own.  Patient denies any focal paraesthesias or weakness.  He has had no  cognitive change.  He has had no speech or swallowing difficulty.  Patient  is pain free.  He was found to be significantly febrile.  He is now admitted  on 24-hour eval, secondary to his inability to stand or ambulate with new  onset weakness.   PAST MEDICAL HISTORY:  Surgical appendectomy at age 77.  Cataract extraction  intraocular lens implant of left in 1989.  He had implant in the right in  1990 with significant problems and eventually loss of vision.  He has a  history of glaucoma.  He has a history of mild hypertension.   CURRENT MEDICATIONS:  Hydrochlorothiazide 12.5 mg daily and eye drops.   Family history is noncontributory.   SOCIAL HISTORY:  Patient resides with his significant other.  He does have a  supportive family.   Review of systems is negative except for the HPI.   PHYSICAL EXAM:  Admission temperature is 102.5.  Blood pressure 158/77.  Heart rate 112.   Respirations 28.  GENERAL APPEARANCE:  He is a pleasant black male in no acute distress.  HEENT EXAM:  Normocephalic.  Atraumatic.  Patient is edentulous.  Throat is  clear.  Conjunctivae and sclerae are clear.  Neck was supple.  There is no  thyromegaly, nodes.  No adenopathy was noted in the cervical,  supraclavicular, axillary regions.  CHEST:  He had no CVA tenderness.  He had good breath sounds with no rales,  wheezes, or rhonchi.  CARDIOVASCULAR:  Two plus radial pulses.  No JVD.  No carotid bruits.  He  had a quiet precordium.  His heart rate was regular.  I appreciated no  murmurs.  ABDOMEN:  Protuberant.  He had very hyperactive bowel sounds with occasional  rushes.  He had no guarding  or rebound.  He had no organomegaly or  splenomegaly.  RECTAL EXAM:  Per the ET, revealed normal sphincter tone with green stool  that was heme-negative.  EXTREMITIES:  Without clubbing or cyanosis.  He has minimal pedal edema.  DERM: Patient has onychomycosis.  He has some dry skin _____________ his  feet.  No other suspicious skin lesions were noted.  NEUROLOGIC EXAM:  Patient is awake, alert, and oriented to person, place,  time, and contact.  His speech is clear and intelligible.  Easy to  understand.  His memory is normal.  Cranial nerves II through XII revealed a chronic nasolabial flattening on  the right. He otherwise had normal facial movements.  Extraocular muscles  were intact.  Patient is blind in his right eye.  He has a highly irregular  pupil on the left that seems to have normal pupillary reaction.  Patient had  no significant deviation of tongue or uvula.  MOTOR STRENGTH: Patient had 4/5 hand grip.  He had 4/5 proximal and distal  upper and lower extremity strength.  He is able to move against gravity,  resist the examiner.  Cerebellar function:  The patient has no rushing  tremor. He has downgoing toes to plantar stimulation.  DTRs were 1+ and  symmetrical.   DATABASE:  CT  scan of the brain without contrast was negative.  Chest x-ray  with no infiltrated.  No effusions.  Sodium was 140.  Potassium 4.1.  Chloride 106.  CO2 27.  BUN of 8.  Creatinine 1.3.  Glucose 110.  Liver  functions were normal.  CK was normal.  Hemoglobin 12.5.  Hematocrit 38.5.  White count was 6,400 with 84% segs, 6% lymphs, 11% monos.  Platelet count  was normal.  UA is pending.   ASSESSMENT/PLAN:  Fever and weakness.  Patient's exam is nonfocal from a  neurologic perspective with a normal CT scan.  Patient is febrile.  His  white count is normal, but he had a left shift.  We suspect the patient has  a GU infection resulting in his significant weakness.   PLAN:  Twenty-four hour eval.  We will give him Rocephin 2 gm I.V. now and 1  gm q.24.  A UA is pending.  We will adjust his medications once more  information is available.  We will reassess his strength in the a.m.   We will continue the patient's hydrochlorothiazide.                                                Rosalyn Gess Norins, M.D. [facility]    MEN/MEDQ  D:  02/05/2002  T:  02/06/2002  Job:  [id number]

## 2010-08-10 NOTE — Discharge Summary (Signed)
   [name], [name]                          ACCOUNT NO.:  [id number]   MEDICAL RECORD NO.:  [id number]                   PATIENT TYPE:  INP   LOCATION:  5025                                 FACILITY:  [facility]   PHYSICIAN:  Rene Paci, M.D. [facility]          DATE OF BIRTH:  [birth year]   DATE OF ADMISSION:  02/05/2002  DATE OF DISCHARGE:  02/10/2002                                 DISCHARGE SUMMARY   DISCHARGE DIAGNOSES:  1. Fever,  2. Right pneumonia.  3. Debilitation.   HISTORY OF PRESENT ILLNESS:  The patient is a 77 year old African-American  male who awoke on the morning of admission with generalized weakness and  inability to get out of bed.  The patient was brought to the [facility]  Emergency Department.  The patient was noted to be febrile.  He was admitted  for 24-hour observation and evaluation.   PAST MEDICAL HISTORY:  1. Status post appendectomy.  2. Status post cataract extraction with intraocular lens implant on the left     in 1989 with history of implant on the right in 1990 with eventual loss     of vision in the right eye.  3. Glaucoma.  4. Hypertension.   HOSPITAL COURSE:  1. INFECTIOUS DISEASE:  The patient was admitted with a febrile illness.     The patient's white count was normal; however, he did have a left shift.     LFTs were normal.  Urinalysis and urine culture were negative.  Chest x-     ray revealed an interstitial infiltrate in the right perihilar region     with a small area of consolidation on the right as well.  The patient was     started on IV Rocephin.  This was changed to Tequin and will have him     complete a full 10-day course of Tequin.  Followup chest x-ray from     today, 02/10/2002, is still pending at the time of this dictation.   1. DEBILITATION:  The patient was seen by physical therapy who made     recommendations for a rolling walker and home health PT. This has been     arranged.   DISCHARGE MEDICATIONS:  1. Tequin  400 mg q.d. for 4 days.  2. HCTZ 12.5 mg q.d.  3. Eye drops as at home.   FOLLOW UP:  Followup with Dr. Debby Bud in one to two weeks.    Cornell Barman, P.A. [facility]                  Rene Paci, M.D. [facility]     LC/MEDQ  D:  02/10/2002  T:  02/10/2002  Job:  [id number]   cc:   Rosalyn Gess. Norins, M.D. [facility]

## 2010-12-17 LAB — BASIC METABOLIC PANEL
CO2: 27
Calcium: 9.3
Creatinine, Ser: 1.67 — ABNORMAL HIGH
GFR calc Af Amer: 48 — ABNORMAL LOW
GFR calc non Af Amer: 39 — ABNORMAL LOW

## 2010-12-17 LAB — URINALYSIS, ROUTINE W REFLEX MICROSCOPIC
Hgb urine dipstick: NEGATIVE
Nitrite: NEGATIVE
Protein, ur: NEGATIVE
Specific Gravity, Urine: 1.013
Urobilinogen, UA: 0.2

## 2010-12-17 LAB — CBC
HCT: 38.4 — ABNORMAL LOW
MCV: 87.1
RBC: 4.4
WBC: 6.1

## 2010-12-17 LAB — DIFFERENTIAL
Eosinophils Absolute: 0.2
Eosinophils Relative: 4
Lymphocytes Relative: 28
Lymphs Abs: 1.7
Monocytes Relative: 8

## 2010-12-25 LAB — IRON AND TIBC
Iron: <10 — ABNORMAL LOW
UIBC: 153

## 2010-12-25 LAB — COMPREHENSIVE METABOLIC PANEL
ALT: 13
AST: 26
BUN: 13
CO2: 27
Calcium: 8.8
Calcium: 9.1
Chloride: 107
Creatinine, Ser: 1.35
Creatinine, Ser: 1.46
GFR calc Af Amer: 56 — ABNORMAL LOW
GFR calc non Af Amer: 46 — ABNORMAL LOW
Glucose, Bld: 91
Sodium: 143
Total Bilirubin: 1
Total Protein: 5.9 — ABNORMAL LOW

## 2010-12-25 LAB — CBC
HCT: 31.9 — ABNORMAL LOW
HCT: 34.6 — ABNORMAL LOW
HCT: 34.9 — ABNORMAL LOW
Hemoglobin: 10.8 — ABNORMAL LOW
Hemoglobin: 11.5 — ABNORMAL LOW
MCHC: 33.2
MCHC: 33.6
MCHC: 33.7
MCV: 89.5
MCV: 89.9
Platelets: 107 — ABNORMAL LOW
Platelets: 112 — ABNORMAL LOW
RBC: 3.82 — ABNORMAL LOW
RBC: 3.9 — ABNORMAL LOW
RDW: 14.4
RDW: 14.6
WBC: 14.9 — ABNORMAL HIGH
WBC: 18.9 — ABNORMAL HIGH

## 2010-12-25 LAB — URINE CULTURE: Colony Count: >=100000

## 2010-12-25 LAB — RETICULOCYTES
RBC.: 3.91 — ABNORMAL LOW
Retic Count, Absolute: 23.5
Retic Ct Pct: 0.6

## 2010-12-25 LAB — URINE MICROSCOPIC-ADD ON

## 2010-12-25 LAB — URINALYSIS, ROUTINE W REFLEX MICROSCOPIC
Bilirubin Urine: NEGATIVE
Glucose, UA: NEGATIVE
Ketones, ur: NEGATIVE
Protein, ur: 30 — AB
Urobilinogen, UA: 1

## 2010-12-25 LAB — POCT CARDIAC MARKERS
CKMB, poc: 2.5
Myoglobin, poc: >500
Troponin i, poc: <0.05

## 2010-12-25 LAB — SEDIMENTATION RATE: Sed Rate: 30 — ABNORMAL HIGH

## 2010-12-25 LAB — CULTURE, BLOOD (ROUTINE X 2): Culture: NO GROWTH

## 2010-12-25 LAB — B-NATRIURETIC PEPTIDE (CONVERTED LAB): Pro B Natriuretic peptide (BNP): 150 — ABNORMAL HIGH

## 2010-12-25 LAB — DIFFERENTIAL
Basophils Absolute: 0.1
Lymphocytes Relative: 4 — ABNORMAL LOW
Lymphs Abs: 0.8
Neutro Abs: 16.2 — ABNORMAL HIGH
Neutrophils Relative %: 86 — ABNORMAL HIGH

## 2010-12-25 LAB — TSH: TSH: 0.426

## 2010-12-25 LAB — APTT: aPTT: 41 — ABNORMAL HIGH

## 2010-12-25 LAB — CK TOTAL AND CKMB (NOT AT ARMC): Relative Index: 1.2

## 2010-12-25 LAB — LIPASE, BLOOD: Lipase: 22

## 2012-03-23 ENCOUNTER — Emergency Department (HOSPITAL_COMMUNITY): Payer: Medicare Other

## 2012-03-23 ENCOUNTER — Encounter (HOSPITAL_COMMUNITY): Payer: Self-pay | Admitting: Emergency Medicine

## 2012-03-23 ENCOUNTER — Inpatient Hospital Stay (HOSPITAL_COMMUNITY)
Admission: EM | Admit: 2012-03-23 | Discharge: 2012-03-26 | DRG: 690 | Disposition: A | Discharge status: Home Health | Payer: Medicare Other | Attending: Internal Medicine | Admitting: Internal Medicine

## 2012-03-23 DIAGNOSIS — R410 Disorientation, unspecified: Secondary | ICD-10-CM | POA: Diagnosis present

## 2012-03-23 DIAGNOSIS — E872 Acidosis, unspecified: Secondary | ICD-10-CM | POA: Diagnosis present

## 2012-03-23 DIAGNOSIS — J40 Bronchitis, not specified as acute or chronic: Secondary | ICD-10-CM | POA: Diagnosis present

## 2012-03-23 DIAGNOSIS — A419 Sepsis, unspecified organism: Secondary | ICD-10-CM

## 2012-03-23 DIAGNOSIS — H409 Unspecified glaucoma: Secondary | ICD-10-CM | POA: Diagnosis present

## 2012-03-23 DIAGNOSIS — D509 Iron deficiency anemia, unspecified: Secondary | ICD-10-CM | POA: Diagnosis present

## 2012-03-23 DIAGNOSIS — H5440 Blindness, one eye, unspecified eye: Secondary | ICD-10-CM | POA: Diagnosis present

## 2012-03-23 DIAGNOSIS — Z79899 Other long term (current) drug therapy: Secondary | ICD-10-CM

## 2012-03-23 DIAGNOSIS — I10 Essential (primary) hypertension: Secondary | ICD-10-CM

## 2012-03-23 DIAGNOSIS — J189 Pneumonia, unspecified organism: Secondary | ICD-10-CM | POA: Diagnosis present

## 2012-03-23 DIAGNOSIS — B961 Klebsiella pneumoniae [K. pneumoniae] as the cause of diseases classified elsewhere: Secondary | ICD-10-CM | POA: Diagnosis present

## 2012-03-23 DIAGNOSIS — E860 Dehydration: Secondary | ICD-10-CM | POA: Diagnosis present

## 2012-03-23 DIAGNOSIS — Z23 Encounter for immunization: Secondary | ICD-10-CM

## 2012-03-23 DIAGNOSIS — R531 Weakness: Secondary | ICD-10-CM

## 2012-03-23 DIAGNOSIS — F29 Unspecified psychosis not due to a substance or known physiological condition: Secondary | ICD-10-CM

## 2012-03-23 DIAGNOSIS — E876 Hypokalemia: Secondary | ICD-10-CM | POA: Diagnosis present

## 2012-03-23 DIAGNOSIS — R197 Diarrhea, unspecified: Secondary | ICD-10-CM

## 2012-03-23 DIAGNOSIS — N289 Disorder of kidney and ureter, unspecified: Secondary | ICD-10-CM | POA: Diagnosis present

## 2012-03-23 DIAGNOSIS — D72829 Elevated white blood cell count, unspecified: Secondary | ICD-10-CM | POA: Diagnosis present

## 2012-03-23 DIAGNOSIS — N390 Urinary tract infection, site not specified: Principal | ICD-10-CM | POA: Diagnosis present

## 2012-03-23 HISTORY — DX: Unspecified hearing loss, unspecified ear: H91.90

## 2012-03-23 LAB — CBC WITH DIFFERENTIAL/PLATELET
Basophils Absolute: 0 10*3/uL (ref 0.0–0.1)
Basophils Relative: 0 % (ref 0–1)
Eosinophils Relative: 0 % (ref 0–5)
HCT: 29.4 % — ABNORMAL LOW (ref 39.0–52.0)
Hemoglobin: 9.7 g/dL — ABNORMAL LOW (ref 13.0–17.0)
Lymphocytes Relative: 3 % — ABNORMAL LOW (ref 12–46)
MCHC: 33 g/dL (ref 30.0–36.0)
MCV: 83.5 fL (ref 78.0–100.0)
Monocytes Absolute: 0.9 10*3/uL (ref 0.1–1.0)
Monocytes Relative: 8 % (ref 3–12)
Neutro Abs: 9.9 10*3/uL — ABNORMAL HIGH (ref 1.7–7.7)
RDW: 14.6 % (ref 11.5–15.5)

## 2012-03-23 LAB — URINALYSIS, ROUTINE W REFLEX MICROSCOPIC
Bilirubin Urine: NEGATIVE
Glucose, UA: NEGATIVE mg/dL
Specific Gravity, Urine: 1.013 (ref 1.005–1.030)

## 2012-03-23 LAB — COMPREHENSIVE METABOLIC PANEL
ALT: 7 U/L (ref 0–53)
Albumin: 2.8 g/dL — ABNORMAL LOW (ref 3.5–5.2)
Alkaline Phosphatase: 81 U/L (ref 39–117)
Calcium: 8.7 mg/dL (ref 8.4–10.5)
GFR calc Af Amer: 55 mL/min — ABNORMAL LOW (ref >90)
Potassium: 2.9 mEq/L — ABNORMAL LOW (ref 3.5–5.1)
Sodium: 139 mEq/L (ref 135–145)
Total Protein: 6.3 g/dL (ref 6.0–8.3)

## 2012-03-23 LAB — CG4 I-STAT (LACTIC ACID): Lactic Acid, Venous: 2.69 mmol/L — ABNORMAL HIGH (ref 0.5–2.2)

## 2012-03-23 LAB — MAGNESIUM: Magnesium: 1.8 mg/dL (ref 1.5–2.5)

## 2012-03-23 MED ORDER — POTASSIUM CHLORIDE 10 MEQ/100ML IV SOLN
10.0000 meq | INTRAVENOUS | Status: DC
  Filled 2012-03-23: qty 100

## 2012-03-23 MED ORDER — ACETAMINOPHEN 325 MG PO TABS: 650.0000 mg | ORAL_TABLET | Freq: Four times a day (QID) | ORAL | Status: DC | PRN

## 2012-03-23 MED ORDER — ACETAMINOPHEN 325 MG PO TABS
650.0000 mg | ORAL_TABLET | Freq: Once | ORAL | Status: DC
  Filled 2012-03-23: qty 2

## 2012-03-23 MED ORDER — DEXTROSE 5 % IV SOLN
1.0000 g | Freq: Once | INTRAVENOUS | Status: AC
  Administered 2012-03-23: 1 g via INTRAVENOUS
  Filled 2012-03-23: qty 10

## 2012-03-23 MED ORDER — DEXTROSE 5 % IV SOLN: 1.0000 g | INTRAVENOUS | Status: DC

## 2012-03-23 MED ORDER — ALUM & MAG HYDROXIDE-SIMETH 200-200-20 MG/5ML PO SUSP: 30.0000 mL | Freq: Four times a day (QID) | ORAL | Status: DC | PRN

## 2012-03-23 MED ORDER — SODIUM CHLORIDE 0.9 % IV SOLN: Freq: Once | INTRAVENOUS | Status: DC

## 2012-03-23 MED ORDER — POTASSIUM CHLORIDE 10 MEQ/100ML IV SOLN
10.0000 meq | INTRAVENOUS | Status: AC
  Filled 2012-03-23 (×2): qty 100

## 2012-03-23 MED ORDER — SODIUM CHLORIDE 0.9 % IV BOLUS (SEPSIS)
500.0000 mL | Freq: Once | INTRAVENOUS | Status: AC
  Administered 2012-03-23: 500 mL via INTRAVENOUS

## 2012-03-23 MED ORDER — LATANOPROST 0.005 % OP SOLN
1.0000 [drp] | Freq: Every day | OPHTHALMIC | Status: DC
  Administered 2012-03-23 – 2012-03-24 (×2): 1 [drp] via OPHTHALMIC
  Filled 2012-03-23: qty 2.5

## 2012-03-23 MED ORDER — PNEUMOCOCCAL VAC POLYVALENT 25 MCG/0.5ML IJ INJ
0.5000 mL | INJECTION | INTRAMUSCULAR | Status: AC
  Administered 2012-03-24: 0.5 mL via INTRAMUSCULAR
  Filled 2012-03-23: qty 0.5

## 2012-03-23 MED ORDER — DEXTROSE 5 % IV SOLN
1.0000 g | INTRAVENOUS | Status: DC
  Administered 2012-03-24 – 2012-03-26 (×3): 1 g via INTRAVENOUS
  Filled 2012-03-23 (×3): qty 10

## 2012-03-23 MED ORDER — POTASSIUM CHLORIDE CRYS ER 20 MEQ PO TBCR
40.0000 meq | EXTENDED_RELEASE_TABLET | Freq: Once | ORAL | Status: AC
  Administered 2012-03-23: 40 meq via ORAL
  Filled 2012-03-23: qty 2

## 2012-03-23 MED ORDER — OXYCODONE HCL 5 MG PO TABS: 5.0000 mg | ORAL_TABLET | ORAL | Status: DC | PRN

## 2012-03-23 MED ORDER — HYDROMORPHONE HCL PF 1 MG/ML IJ SOLN: 0.5000 mg | INTRAMUSCULAR | Status: DC | PRN

## 2012-03-23 MED ORDER — ZOLPIDEM TARTRATE 5 MG PO TABS: 5.0000 mg | ORAL_TABLET | Freq: Every evening | ORAL | Status: DC | PRN

## 2012-03-23 MED ORDER — ACETAMINOPHEN 650 MG RE SUPP: 650.0000 mg | Freq: Four times a day (QID) | RECTAL | Status: DC | PRN

## 2012-03-23 MED ORDER — DEXTROSE 5 % IV SOLN
250.0000 mg | INTRAVENOUS | Status: DC
  Administered 2012-03-24 – 2012-03-26 (×3): 250 mg via INTRAVENOUS
  Filled 2012-03-23 (×3): qty 250

## 2012-03-23 MED ORDER — ENOXAPARIN SODIUM 40 MG/0.4ML ~~LOC~~ SOLN
40.0000 mg | SUBCUTANEOUS | Status: DC
  Administered 2012-03-23 – 2012-03-25 (×3): 40 mg via SUBCUTANEOUS
  Filled 2012-03-23 (×4): qty 0.4

## 2012-03-23 MED ORDER — ONDANSETRON HCL 4 MG PO TABS: 4.0000 mg | ORAL_TABLET | Freq: Four times a day (QID) | ORAL | Status: DC | PRN

## 2012-03-23 MED ORDER — POTASSIUM CHLORIDE CRYS ER 20 MEQ PO TBCR
40.0000 meq | EXTENDED_RELEASE_TABLET | ORAL | Status: AC
  Administered 2012-03-23: 40 meq via ORAL
  Administered 2012-03-23: 20 meq via ORAL
  Administered 2012-03-23: 40 meq via ORAL
  Filled 2012-03-23 (×2): qty 2

## 2012-03-23 MED ORDER — ONDANSETRON HCL 4 MG/2ML IJ SOLN: 4.0000 mg | Freq: Four times a day (QID) | INTRAMUSCULAR | Status: DC | PRN

## 2012-03-23 MED ORDER — SODIUM CHLORIDE 0.9 % IJ SOLN
3.0000 mL | Freq: Two times a day (BID) | INTRAMUSCULAR | Status: DC
  Administered 2012-03-25 – 2012-03-26 (×3): 3 mL via INTRAVENOUS

## 2012-03-23 MED ORDER — DEXTROSE 5 % IV SOLN
500.0000 mg | INTRAVENOUS | Status: DC
  Administered 2012-03-23: 500 mg via INTRAVENOUS
  Filled 2012-03-23 (×2): qty 500

## 2012-03-23 MED ORDER — TIMOLOL MALEATE 0.5 % OP SOLG
1.0000 [drp] | Freq: Every day | OPHTHALMIC | Status: DC
  Administered 2012-03-23 – 2012-03-24 (×3): 1 [drp] via OPHTHALMIC
  Filled 2012-03-23: qty 5

## 2012-03-23 MED ORDER — SODIUM CHLORIDE 0.9 % IV SOLN
INTRAVENOUS | Status: DC
  Administered 2012-03-23 – 2012-03-24 (×2): via INTRAVENOUS

## 2012-03-23 NOTE — ED Notes (Signed)
Lab at bedside

## 2012-03-23 NOTE — ED Notes (Signed)
Pt received Tylenol 650mg  via EMS

## 2012-03-23 NOTE — H&P (Addendum)
[facility] History and Physical  [name] ZOX:[id number] DOB: 1923/03/10 DOA: 03/23/2012  Referring physician: EDP PCP: Illene Regulus, MD  Specialists:   Chief Complaint: Weakness and Confusion  HPI: [name] is a 77 y.o. male who awakened from a nap at midnight and was found by his daughter to have complaints of chills.  The daughter gives the history and reports that the patient was more confused than he usually is at night, and he was more weak than usual and needed more assistance to get to the bathroom. Before he was able to get to the bathroom, he passed 1 loose stool.   The daughter thought he may have a stomach virus, since she was ill this past week.   She called EMS.     In the ED, he was found to have pneumonia on Chest X-ray, and a UTI.  The Lactate level was found to be 2.6.  He was placed on antibiotic therapy to cover CAP, and referred for admission.      Review of Systems:  The patient has decreased hearing, decreased vision, and walks with assistance.      The patient denies anorexia, fever, weight loss, hoarseness, chest pain, syncope, dyspnea on exertion, peripheral edema, balance deficits, hemoptysis, abdominal pain, nausea, vomiting, constipation, hematemesis, melena, hematochezia, severe indigestion/heartburn, hematuria, incontinence, dysuria, muscle weakness, suspicious skin lesions, transient blindness, depression, unusual weight change, abnormal bleeding, enlarged lymph nodes, angioedema, and breast masses.    Past Medical History  Diagnosis Date  . Unspecified visual loss   . Blindness of right eye   . Anemia, iron deficiency   . Glaucoma(365)   . Hearing decreased    Past Surgical History  Procedure Date  . Appendectomy Age 46  . Cataract extraction     Left 1989, Right 1990    Medications:  HOME MEDS: Prior to Admission medications   Medication Sig Start Date End Date Taking? Authorizing Provider  latanoprost (XALATAN) 0.005 %  ophthalmic solution Place 1 drop into both eyes daily.     Yes Historical Provider, MD  timolol (TIMOPTIC-XR) 0.5 % ophthalmic gel-forming Place 1 drop into both eyes daily.     Yes Historical Provider, MD  acetaminophen (TYLENOL) 650 MG CR tablet Take 650 mg by mouth once.    Historical Provider, MD    Allergies:  No Known Allergies  Social History:   does not have a smoking history on file. He does not have any smokeless tobacco history on file. His alcohol and drug histories not on file.  Family History: Family History  Problem Relation Age of Onset  . Glaucoma Daughter   . Diabetes Father   . Cancer - Other Father     Bone  . Cancer - Other Brother     Liver     Physical Exam:  GEN:  Pleasant Elderly thin 78 year old African American Male examined  and in no acute distress; cooperative with exam Filed Vitals:   03/23/12 0330 03/23/12 0421 03/23/12 0500 03/23/12 0512  BP: 120/67 113/60 124/62   Pulse: 83 78 107   Temp:  99.1 F (37.3 C)    TempSrc:  Oral    Resp:  16    SpO2: 98% 99% 100% 100%   Blood pressure 124/62, pulse 107, temperature 99.1 F (37.3 C), temperature source Oral, resp. rate 16, SpO2 100.00%. PSYCH: He is alert and oriented x 1; does not appear anxious does not appear depressed; affect is normal HEENT:  Normocephalic and Atraumatic, Mucous membranes pink; PERRLA; EOM intact; Fundi:  Benign;  No scleral icterus, Nares: Patent, Oropharynx: Clear, Fair Dentition, Neck:  FROM, no cervical lymphadenopathy nor thyromegaly or carotid bruit; no JVD; Breasts:: Not examined CHEST WALL: No tenderness CHEST: Normal respiration, clear to auscultation bilaterally HEART: Regular rate and rhythm; no murmurs rubs or gallops BACK: No kyphosis or scoliosis; no CVA tenderness ABDOMEN: Positive Bowel Sounds, Scaphoid,  soft non-tender; no masses, no organomegaly.     Rectal Exam: Not done EXTREMITIES: No bone or joint deformity; age-appropriate arthropathy of the hands  and knees; no cyanosis, clubbing or edema; no ulcerations. Genitalia: not examined PULSES: 2+ and symmetric SKIN: Decreased hydration, scaling skin on BLEs, No Ulceration CNS: Cranial nerves 2-12 grossly intact no focal neurologic deficit    Labs on Admission:  Basic Metabolic Panel:  Lab 03/23/12 1478  NA 139  K 2.9*  CL 103  CO2 26  GLUCOSE 116*  BUN 13  CREATININE 1.31  CALCIUM 8.7  MG --  PHOS --   Liver Function Tests:  Lab 03/23/12 0209  AST 17  ALT 7  ALKPHOS 81  BILITOT 0.4  PROT 6.3  ALBUMIN 2.8*    Lab 03/23/12 0209  LIPASE 35  AMYLASE --   No results found for this basename: AMMONIA:5 in the last 168 hours CBC:  Lab 03/23/12 0209  WBC 11.1*  NEUTROABS 9.9*  HGB 9.7*  HCT 29.4*  MCV 83.5  PLT 127*   Cardiac Enzymes: No results found for this basename: CKTOTAL:5,CKMB:5,CKMBINDEX:5,TROPONINI:5 in the last 168 hours  BNP (last 3 results) No results found for this basename: PROBNP:3 in the last 8760 hours CBG: No results found for this basename: GLUCAP:5 in the last 168 hours  Radiological Exams on Admission: Dg Chest Port 1 View  03/23/2012  *RADIOLOGY REPORT*  Clinical Data: Cough and shortness of breath.  PORTABLE CHEST - 1 VIEW  Comparison: Chest radiograph performed 05/31/2007  Findings: The lungs are well-aerated.  Mild retrocardiac opacity may reflect atelectasis or possibly mild pneumonia.  Minimal nodular densities at the right lung apex are nonspecific and may reflect remote granulomatous disease.  There is no evidence of pleural effusion or pneumothorax.  The cardiomediastinal silhouette is within normal limits.  No acute osseous abnormalities are seen.  IMPRESSION: Mild retrocardiac opacity may reflect atelectasis or possibly mild pneumonia.   Original Report Authenticated By: Tonia Ghent, M.D.      Assessment: Principal Problem:  *CAP (community acquired pneumonia) Active Problems:  UTI (lower urinary tract infection)   Confusion  Weakness generalized  Hypokalemia  ANEMIA, IRON DEFICIENCY  GLAUCOMA    Plan:      Admit to Telemetry Bed IV Rocephin and Azithromycin to cover CAP and UTI Nebs, O2 Gentle IVFs Replete K+, and Check Magnesium level replete if needed.   Reconcile Home Medications DVT Prophylaxis Message left with Answering Service for Dr. Debby Bud as Notification of Admission on 12/30 AM.       Code Status:  FULL CODE Family Communication:  Daughter Chemical engineer) at Bedside Disposition Plan:  Return to Home on discharge  Time spent: 71 Minutes Ron Parker [facility] Pager [phone]  If 7PM-7AM, please contact night-coverage [email or web address] Password [email or web address] 03/23/2012, 5:23 AM

## 2012-03-23 NOTE — ED Notes (Signed)
Per EMS, Pt having fever and diarrhea. On scene pt had a bowel movement in bed, loose stool. No blood present in stool. Onset 1 hr ago. 103.68F orally at arrival. 20G in L arm. of fluid given via EMS. BP 140/60, HR 90, RR 18.

## 2012-03-23 NOTE — Progress Notes (Signed)
Utilization Review Completed.[name] T12/30/2013

## 2012-03-23 NOTE — ED Notes (Signed)
Per pt family, when pt woke up from a nap approx midnight, he was "clawing at the wall" trying to get out.

## 2012-03-23 NOTE — ED Notes (Signed)
Pt pulled IV out, anti-biotic paused and IV access attempted.

## 2012-03-23 NOTE — ED Notes (Signed)
Family at bedside. 

## 2012-03-23 NOTE — ED Provider Notes (Addendum)
History     CSN: [id number]  Arrival date & time 03/23/12  0140   First MD Initiated Contact with Patient 03/23/12 0158      Chief Complaint  Patient presents with  . Fatigue  . Diarrhea  . Fever     Patient is a 78 y.o. male presenting with diarrhea and fever. The history is provided by the patient and a caregiver.  Diarrhea The primary symptoms include fever, fatigue and diarrhea. Primary symptoms do not include abdominal pain or vomiting. The illness began yesterday. The onset was gradual. The problem has been gradually worsening.  The illness is also significant for chills.  Fever Primary symptoms of the febrile illness include fever, fatigue, cough and diarrhea. Primary symptoms do not include shortness of breath, abdominal pain or vomiting.  nothing improves his symptoms  Per family, reported he had chills, he felt generalized weakness and he had episode of nonbloody diarrhea.  No vomiting.  He does have fever.  No abd pain.  No falls.  He has frequent bouts of confusion recently, but tonight seemed worse with fever.  He had otherwise been at his baseline.    Past Medical History  Diagnosis Date  . Unspecified visual loss   . Blindness of right eye   . Anemia, iron deficiency   . Glaucoma(365)     Past Surgical History  Procedure Date  . Appendectomy Age 74  . Cataract extraction     Left 1989, Right 1990    No family history on file.  History  Substance Use Topics  . Smoking status: Not on file  . Smokeless tobacco: Not on file  . Alcohol Use: Not on file      Review of Systems  Constitutional: Positive for fever, chills and fatigue.  Respiratory: Positive for cough. Negative for shortness of breath.   Gastrointestinal: Positive for diarrhea. Negative for vomiting and abdominal pain.  Neurological: Positive for weakness.  All other systems reviewed and are negative.    Allergies  Review of patient's allergies indicates no known allergies.  Home  Medications   Current Outpatient Rx  Name  Route  Sig  Dispense  Refill  . LATANOPROST 0.005 % OP SOLN   Both Eyes   Place 1 drop into both eyes daily.           Marland Kitchen TIMOLOL MALEATE 0.5 % OP SOLG   Both Eyes   Place 1 drop into both eyes daily.           . ACETAMINOPHEN ER 650 MG PO TBCR   Oral   Take 650 mg by mouth once.           BP 136/58  Pulse 93  Temp 102.6 F (39.2 C) (Oral)  Resp 18  SpO2 94%  Physical Exam CONSTITUTIONAL: Well developed/well nourished HEAD AND FACE: Normocephalic/atraumatic ENMT: Mucous membranes moist NECK: supple no meningeal signs SPINE:entire spine nontender CV: S1/S2 noted, no murmurs/rubs/gallops noted LUNGS: Lungs are clear to auscultation bilaterally, no apparent distress ABDOMEN: soft, nontender, no rebound or guarding GU:no cva tenderness NEURO: Pt is awake/alert, moves all extremitiesx4, no focal weakness noted Pt appears confused at times but answers most questions appropriately EXTREMITIES: pulses normal, full ROM SKIN: warm, color normal PSYCH: no abnormalities of mood noted  ED Course  Procedures   Labs Reviewed  CBC WITH DIFFERENTIAL - Abnormal; Notable for the following:    WBC 11.1 (*)     RBC 3.52 (*)  Hemoglobin 9.7 (*)     HCT 29.4 (*)     Platelets 127 (*)     Neutrophils Relative 89 (*)     Neutro Abs 9.9 (*)     Lymphocytes Relative 3 (*)     Lymphs Abs 0.4 (*)     All other components within normal limits  CG4 I-STAT (LACTIC ACID) - Abnormal; Notable for the following:    Lactic Acid, Venous 2.69 (*)     All other components within normal limits  COMPREHENSIVE METABOLIC PANEL  LIPASE, BLOOD  URINALYSIS, ROUTINE W REFLEX MICROSCOPIC   2:45 AM Pt with onset of fever/diarrhea/chills.  Will give IV fluids, check CXR/labs and reassess Pt lives with family who are his caregivers 4:13 AM uti noted.  ?pneumonia noted.  Also he is dehydrated.  Given age/history/exam and his findings recommended admit.   Patient and family agreeable  MDM  Nursing notes including past medical history and social history reviewed and considered in documentation Labs/vital reviewed and considered xrays reviewed and considered         Joya Gaskins, MD 03/23/12 1610  Joya Gaskins, MD 03/23/12 [phone]

## 2012-03-23 NOTE — ED Notes (Signed)
Pt repositioned in bed.

## 2012-03-23 NOTE — Progress Notes (Signed)
Admission note reviewed. Labs and x-ray reviewed - no distinct or definitive infiltrate; positive Urinalysis. Spoke with patient who was easily awakened. He is comfortable. No increased work of breathing.  Plan - continue treatment per admission orders.

## 2012-03-24 ENCOUNTER — Inpatient Hospital Stay (HOSPITAL_COMMUNITY): Payer: Medicare Other

## 2012-03-24 DIAGNOSIS — J189 Pneumonia, unspecified organism: Secondary | ICD-10-CM

## 2012-03-24 DIAGNOSIS — N390 Urinary tract infection, site not specified: Principal | ICD-10-CM

## 2012-03-24 DIAGNOSIS — H409 Unspecified glaucoma: Secondary | ICD-10-CM

## 2012-03-24 DIAGNOSIS — I10 Essential (primary) hypertension: Secondary | ICD-10-CM

## 2012-03-24 DIAGNOSIS — A419 Sepsis, unspecified organism: Secondary | ICD-10-CM

## 2012-03-24 DIAGNOSIS — E876 Hypokalemia: Secondary | ICD-10-CM

## 2012-03-24 DIAGNOSIS — E860 Dehydration: Secondary | ICD-10-CM

## 2012-03-24 LAB — BASIC METABOLIC PANEL
BUN: 14 mg/dL (ref 6–23)
GFR calc Af Amer: 52 mL/min — ABNORMAL LOW (ref >90)
GFR calc non Af Amer: 45 mL/min — ABNORMAL LOW (ref >90)
Potassium: 5.6 mEq/L — ABNORMAL HIGH (ref 3.5–5.1)

## 2012-03-24 LAB — CBC
Hemoglobin: 9.4 g/dL — ABNORMAL LOW (ref 13.0–17.0)
MCHC: 35.1 g/dL (ref 30.0–36.0)
RDW: 14.8 % (ref 11.5–15.5)
WBC: 15.9 10*3/uL — ABNORMAL HIGH (ref 4.0–10.5)

## 2012-03-24 MED ORDER — TIMOLOL MALEATE 0.5 % OP SOLG
1.0000 [drp] | Freq: Two times a day (BID) | OPHTHALMIC | Status: DC
  Administered 2012-03-24 – 2012-03-26 (×4): 1 [drp] via OPHTHALMIC

## 2012-03-24 MED ORDER — LATANOPROST 0.005 % OP SOLN
1.0000 [drp] | Freq: Two times a day (BID) | OPHTHALMIC | Status: DC
  Administered 2012-03-24 – 2012-03-26 (×4): 1 [drp] via OPHTHALMIC

## 2012-03-24 NOTE — Progress Notes (Signed)
Subjective: Easily awakened. He is blind in the right eye and has poor vision left eye; hard of hearing but mentally sharp. Has c/o epigastric pain. No SOB, no cough.  Objective: Lab: Lab Results  Component Value Date   WBC 15.9* 03/24/2012   HGB 9.4* 03/24/2012   HCT 26.8* 03/24/2012   MCV 83.2 03/24/2012   PLT 136* 03/24/2012   BMET    Component Value Date/Time   NA 138 03/24/2012 0435   K 5.6* 03/24/2012 0435   CL 107 03/24/2012 0435   CO2 25 03/24/2012 0435   GLUCOSE 87 03/24/2012 0435   BUN 14 03/24/2012 0435   CREATININE 1.37* 03/24/2012 0435   CALCIUM 8.5 03/24/2012 0435   GFRNONAA 45* 03/24/2012 0435   GFRAA 52* 03/24/2012 0435   Micro exam: TNTC WBC's per HPF and many+ bacteria. Urine culture: >100,000 GNR    Imaging: no new imaging Scheduled Meds:   . sodium chloride   Intravenous Once  . azithromycin  250 mg Intravenous Q24H  . cefTRIAXone (ROCEPHIN)  IV  1 g Intravenous Q24H  . enoxaparin (LOVENOX) injection  40 mg Subcutaneous Q24H  . latanoprost  1 drop Both Eyes Daily  . pneumococcal 23 valent vaccine  0.5 mL Intramuscular Tomorrow-1000  . sodium chloride  3 mL Intravenous Q12H  . timolol  1 drop Both Eyes Daily   Continuous Infusions:   . sodium chloride 75 mL/hr at 03/23/12 2025   PRN Meds:.acetaminophen, acetaminophen, alum & mag hydroxide-simeth, HYDROmorphone (DILAUDID) injection, ondansetron (ZOFRAN) IV, ondansetron, oxyCODONE, zolpidem   Physical Exam: Filed Vitals:   03/24/12 0504  BP: 134/56  Pulse: 54  Temp: 98.4 F (36.9 C)  Resp: 18   Gen'l- very elderly AA man in no acute distress HEENT - C&S clear Cor - 2+ radial, RRR PUlm - normal respirations, no increased WOB, no rales or wheezes Abd - BS+, very tender at the epigastrium Neuro - A&O     Assessment/Plan: 1. Pulm/ID - little evidence for PNA - mild X-ray changes, leukocytosis. No cough or sputum production and normal respirations. Day #2 Rocephin/Azithro  Plan -    F/u cxr  2. GU/ID - urosepsis with fever, leukocytosis, initial hypotension. Urine culture positive for GNR. Day # 2 Rocephin. Mild renal insufficiency  Plan Continue rocephin  Continue hydration.  3. Hypokalemia  Corrected  Plan F/u Bmet      [employer] [facility] (o) [phone]; (c) [phone] Call-grp - [facility]  Tele: [phone]  03/24/2012, 6:59 AM

## 2012-03-24 NOTE — Clinical Documentation Improvement (Signed)
SEPSIS DOCUMENTATION QUERY  THIS DOCUMENT IS NOT A PERMANENT PART OF THE MEDICAL RECORD  TO RESPOND TO THE THIS QUERY, FOLLOW THE INSTRUCTIONS BELOW:  1. If needed, update documentation for the patient's encounter via the notes activity.  2. Access this query again and click edit on the In [employer].  3. After updating, or not, click F2 to complete all highlighted (required) fields concerning your review. Select "additional documentation in the medical record" OR "no additional documentation provided".  4. Click Sign note button.  5. The deficiency will fall out of your In Basket *Please let us know if you are not able to complete this workflow by phone or e-mail (listed below).  Please update your documentation within the medical record to reflect your response to this query.                                                                                    03/24/12  Dear Dr. Debby Bud Marton Red[place],  In a better effort to capture your patient's severity of illness, reflect appropriate length of stay and utilization of resources, a review of the patient medical record has revealed the following indicators.    Based on your clinical judgment, please clarify and document in a progress note and/or discharge summary the clinical condition associated with the following supporting information:  In responding to this query please exercise your independent judgment.  The fact that a query is asked, does not imply that any particular answer is desired or expected.   Possible Clinical Conditions? Sepsis with UTI Septicemia / Sepsis Severe Sepsis Other Condition _ Cannot clinically Determine  Risk Factors:(As per notes)  " UTI & CAP" Presenting Signs and Symptoms:(As per notes)"urosepsis with fever, leukocytosis, initial hypotension"  Cultures:Urineculture positive for GNR. Day # 2 Rocephin  Radiology:  IMPRESSION:  Mild retrocardiac opacity may reflect atelectasis or possibly mild    pneumonia  Treatment:Rocephin  Reviewed: additional documentation in the medical record  Thank You,  Joanette Gula Delk RN, BSN, CCDS Clinical Documentation Specialist: [phone] Pager Health Information Management [place]

## 2012-03-25 ENCOUNTER — Encounter (HOSPITAL_COMMUNITY): Payer: Self-pay | Admitting: General Practice

## 2012-03-25 LAB — BASIC METABOLIC PANEL
CO2: 25 mEq/L (ref 19–32)
Chloride: 107 mEq/L (ref 96–112)
Glucose, Bld: 82 mg/dL (ref 70–99)
Potassium: 4.9 mEq/L (ref 3.5–5.1)
Sodium: 139 mEq/L (ref 135–145)

## 2012-03-25 LAB — CBC WITH DIFFERENTIAL/PLATELET
Lymphocytes Relative: 25 % (ref 12–46)
Lymphs Abs: 2.3 10*3/uL (ref 0.7–4.0)
MCV: 82.7 fL (ref 78.0–100.0)
Neutrophils Relative %: 64 % (ref 43–77)
Platelets: 145 10*3/uL — ABNORMAL LOW (ref 150–400)
RBC: 3.65 MIL/uL — ABNORMAL LOW (ref 4.22–5.81)
WBC: 9.1 10*3/uL (ref 4.0–10.5)

## 2012-03-25 LAB — URINE CULTURE: Colony Count: >=100000

## 2012-03-25 NOTE — Progress Notes (Signed)
Subjective: No complaints   Objective: Vital signs in last 24 hours: Temp:  [98.7 F (37.1 C)-98.9 F (37.2 C)] 98.7 F (37.1 C) (01/01 0626) Pulse Rate:  [56-77] 56  (01/01 0626) Resp:  [18] 18  (01/01 0626) BP: (125-177)/(69-78) 177/69 mmHg (01/01 0626) SpO2:  [100 %] 100 % (01/01 0626) Weight change:     Intake/Output from previous day: 12/31 0701 - 01/01 0700 In: 1715 [P.O.:840; I.V.:825; IV Piggyback:50] Out: 1850 [Urine:1850] Intake/Output this shift:    General appearance: alert Resp: clear to auscultation bilaterally GI: soft, non-tender; bowel sounds normal; no masses,  no organomegaly  Lab Results:  [facility] 03/25/12 0640 03/24/12 0435  WBC 9.1 15.9*  HGB 10.5* 9.4*  HCT 30.2* 26.8*  PLT 145* 136*   BMET  Basename 03/24/12 0435 03/23/12 0209  NA 138 139  K 5.6* 2.9*  CL 107 103  CO2 25 26  GLUCOSE 87 116*  BUN 14 13  CREATININE 1.37* 1.31  CALCIUM 8.5 8.7    Studies/Results: Dg Chest 2 View  03/24/2012  *RADIOLOGY REPORT*  Clinical Data: Weakness and shortness of breath.  CHEST - 2 VIEW  Comparison: 03/23/2012  Findings: Perihilar interstitial accentuation noted.  Prior retrocardiac airspace opacity has primarily cleared.  Heart size within normal limits.  No pleural effusion observed.  IMPRESSION:  1.  Interstitial accentuation, particularly in the perihilar regions, suspicious for bronchitis, reactive airways disease, or atypical infectious process.   Original Report Authenticated By: Gaylyn Rong, M.D.     Medications: I have reviewed the patient's current medications.  Assessment/Plan: UTI- Klebsiella on culture pan-sensitive- continue Rocephin for today- change to PO tomorrow PNA- xray better- clinically ok, d/c Zithromax,  continue rocephin K ok WBC better  IVF- Hep lock BP mild high will watch- ? On BP med art home Case management   LOS: 2 days   [name] 03/25/2012, 8:36 AM

## 2012-03-26 DIAGNOSIS — R5381 Other malaise: Secondary | ICD-10-CM

## 2012-03-26 DIAGNOSIS — H5440 Blindness, one eye, unspecified eye: Secondary | ICD-10-CM

## 2012-03-26 LAB — BASIC METABOLIC PANEL
BUN: 9 mg/dL (ref 6–23)
CO2: 26 mEq/L (ref 19–32)
Chloride: 107 mEq/L (ref 96–112)
GFR calc Af Amer: 62 mL/min — ABNORMAL LOW (ref >90)
Glucose, Bld: 90 mg/dL (ref 70–99)
Potassium: 5 mEq/L (ref 3.5–5.1)

## 2012-03-26 MED ORDER — CEFUROXIME AXETIL 250 MG PO TABS: 250.0000 mg | ORAL_TABLET | Freq: Two times a day (BID) | ORAL | Status: DC

## 2012-03-26 NOTE — Discharge Summary (Signed)
NAMECALVERT, CHARLAND NO.:  [id number]  MEDICAL RECORD NO.:  [id number]  LOCATION:  2039                         FACILITY:  [facility]  PHYSICIAN:  Rosalyn Gess. Jerzy Crotteau, MD  DATE OF BIRTH:  1923/01/30  DATE OF ADMISSION:  03/23/2012 DATE OF DISCHARGE:  03/26/2012                              DISCHARGE SUMMARY   ADMITTING DIAGNOSES: 1. Community-acquired pneumonia. 2. Urinary tract infection. 3. Confusion. 4. Hypokalemia. 5. Acute renal insufficiency. 6. Iron deficiency anemia. 7. 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  DISCHARGE DIAGNOSES: 1. Urinary tract infection. 2. Hypokalemia, resolved. 3. Renal insufficiency, resolved. 4. Chronic anemia. 5. History of glaucoma.  CONSULTANTS:  None.  PROCEDURES: 1. A chest x-ray with mild retrocardiac opacity which may reflect     atelectasis or possibly pneumonia. 2. Chest, 2 view, December, 31, 2013, which showed interstitial     accentuation, particularly in the perihilar region, suspicious for     bronchitis, reactive airway disease or atypical infectious process.  HISTORY OF PRESENT ILLNESS:  Ms. Schroeter is an 79 year old gentleman who has been managed at home by his wife and daughter.  He was found by his daughter to have complaints of chills.  She reports the patient was more confused than usual, weaker than usual, and needed more assistance at the bathroom than usual.  He did have 1 loose stool.  The patient's daughter was concerned that he was having stomach virus since he had been ill during the past week.  She called EMS for assistance.  The patient was transported to the [facility] Emergency Department where he was found to have a question of atelectasis versus infiltrate on chest x- ray and had a definite UTI with a lactic acidosis, suggestive of sepsis. The patient had a temperature of 102, suggestive of sepsis.  He was mildly tachypneic.  His blood pressure was stable.  The patient was subsequently  admitted for treatment of his urinary tract infection and possible respiratory infection.  Please see the H and P for past medical history, family history, social history, and admission examination.  HOSPITAL COURSE: 1. Pulmonary/ID.  The patient's x-ray was with mild change, did have a     leukocytosis.  The patient had no cough or sputum production.  He     had normal respirations and normal oxygen saturations.  The patient     was initially treated with Rocephin and azithromycin.  The patient     did have a followup chest x-ray which showed no significant     infiltrate, but mild bronchitis.  He was continued on his     antibiotics, able to be switched to oral antibiotics at the time of     discharge. 2. GU/ID.  The patient with urosepsis with fever and leukocytosis,     renal insufficiency, although his blood pressure was stable.  The     patient did have a positive urine culture for Gram-negative rods     which speciated out as Klebsiella pneumoniae that was pansensitive.     The patient was on Rocephin and it will be converted over to Ceftin     at the time of discharge. 3.  Renal insufficiency.  The patient did have mild renal     insufficiency, consistent with acute dehydration.  His initial     creatinine was 1.37 and with hydration, this returned to normal at     1.18 on the day of discharge. 4. Hypokalemia.  The patient's initial potassium was 5.6, at the time     of discharge, it was 5.0. 5. The patient's anemia and glaucoma with blindness remained stable. 6. With the patient being hemodynamically stable, having no fever with     his white count having come down to normal at 9100, with him being     able to take oral antibiotics for sensitive Klebsiella pneumoniae,     he is ready for discharge home.  DISCHARGE PHYSICAL EXAMINATION:  VITAL SIGNS:  Temperature was 98.1, blood pressure 159/68, pulse 67, respirations 18, oxygen saturations 100% on 2 L. GENERAL APPEARANCE:   This is a pleasant elderly African American gentleman, in no acute distress. HEENT:  Right eye is essentially closed, he is blind in that eye.  Left eye with conjunctiva and sclera being clear.  Pupils seems to be reactive. NECK:  Supple. CHEST:  Patient is moving air well.  There is no increased work of breathing.  There are no rales, wheezes, or rhonchi. CARDIOVASCULAR:  2+ radial pulse.  He had a quiet precordium.  His heart sounds were distant, but regular. ABDOMEN:  Soft with positive bowel sounds.  No guarding or rebound was noted. EXTREMITIES:  Without deformity. NEUROLOGIC:  The patient is awake.  He is alert.  He is oriented to person, place, and context.  He has significant visual impairment.  He is able to move all of his extremities to command.  FINAL LABORATORY DATA:  Metabolic panel from the day of discharge with a sodium of 139, potassium 5, chloride 107, CO2 of 26, BUN of 9, creatinine 1.18, glucose was 90.  CBC from March 25, 2012, with a white count of 9100, with 64% segs, 25% lymphs, 9% monos, 2% eosinophils. Hemoglobin 10.5 g, platelet count 145,000.  DISPOSITION:  The patient will be discharged home.  Given his age, visual impairments, and weakness, he will be referred to home health for RN followup to monitor hemodynamics, also PT for evaluation and treatment for strengthening.  The patient will be seen in the office for followup within 7 days.  The patient's condition at the time of discharge dictation is medically stable.    Rosalyn Gess Lai Hendriks, MD    MEN/MEDQ  D:  03/26/2012  T:  03/26/2012  Job:  [id number]

## 2012-03-26 NOTE — Progress Notes (Addendum)
Spoke with daughter [name] and informed her of pt being d/c home; daughter agrees to take pt home and continue taking care of him as she did prior to admission; pt lives with daughter; CM to speak with daughter regarding HH needs once she arrives; will review d/c instructions with daughter once she arrives as well.

## 2012-03-26 NOTE — Care Management Note (Signed)
    Page 1 of 2   03/26/2012     2:46:32 PM   CARE MANAGEMENT NOTE 03/26/2012  Patient:  [name], [name]   Account Number:  [id number]  Date Initiated:  03/26/2012  Documentation initiated by:  Imanuel Pruiett  Subjective/Objective Assessment:   PT ADM ON 03/23/12 WITH PNA AND UTI.  PTA, PT RESIDES AT HOME AND IS CARED FOR BY [place], DONNA [facility].     Action/Plan:   MET WITH PT AND DAUGHTER TO DISCUSS DC NEEDS.  PT HAS HX OF HH WITH [facility], AND DTR WISHES TO USE AGAIN.  DAUGHTER WISHES BATH AIDE BE ADDED TO HH ORDERS.   Anticipated DC Date:  03/26/2012   Anticipated DC Plan:  HOME W HOME HEALTH SERVICES      DC Planning Services  CM consult      [facility] Choice  HOME HEALTH   Choice offered to / List presented to:  C-2 HC POA / Guardian        HH arranged  HH-1 RN  HH-2 PT  HH-4 NURSE'S AIDE      HH agency  [facility]   Status of service:  Completed, signed off Medicare Important Message given?   (If response is "NO", the following Medicare IM given date fields will be blank) Date Medicare IM given:   Date Additional Medicare IM given:    Discharge Disposition:  HOME W HOME HEALTH SERVICES  Per UR Regulation:  Reviewed for med. necessity/level of care/duration of stay  If discussed at Long Length of Stay Meetings, dates discussed:    Comments:  03/26/12 Osamah Schmader,RN,BSN [phone] DC HOME TODAY.  REFERRAL TO [facility] FOR HH FOLLOW UP.  START OF CARE 24-48H POST DC DATE.

## 2012-03-26 NOTE — Progress Notes (Signed)
Subjective: Feeling much better. Taking a diet. Is willing to go home.  Objective: Lab: Lab Results  Component Value Date   WBC 9.1 03/25/2012   HGB 10.5* 03/25/2012   HCT 30.2* 03/25/2012   MCV 82.7 03/25/2012   PLT 145* 03/25/2012   BMET    Component Value Date/Time   NA 139 03/26/2012 0435   K 5.0 03/26/2012 0435   CL 107 03/26/2012 0435   CO2 26 03/26/2012 0435   GLUCOSE 90 03/26/2012 0435   BUN 9 03/26/2012 0435   CREATININE 1.18 03/26/2012 0435   CALCIUM 8.9 03/26/2012 0435   GFRNONAA 54* 03/26/2012 0435   GFRAA 62* 03/26/2012 0435   Uricne cx - Klebsiella - pan sensitive  Imaging:  Scheduled Meds:   . sodium chloride   Intravenous Once  . azithromycin  250 mg Intravenous Q24H  . cefTRIAXone (ROCEPHIN)  IV  1 g Intravenous Q24H  . enoxaparin (LOVENOX) injection  40 mg Subcutaneous Q24H  . latanoprost  1 drop Both Eyes BID  . sodium chloride  3 mL Intravenous Q12H  . timolol  1 drop Both Eyes BID   Continuous Infusions:  PRN Meds:.acetaminophen, acetaminophen, alum & mag hydroxide-simeth, HYDROmorphone (DILAUDID) injection, ondansetron (ZOFRAN) IV, ondansetron, oxyCODONE, zolpidem   Physical Exam: Filed Vitals:   03/26/12 0556  BP: 159/68  Pulse: 57  Temp: 98.1 F (36.7 C)  Resp: 18  see d/c summary      Assessment/Plan: Doing well enough to be discharged to home to complete antibiotics. He will require HH RN, PT - F-F completed. Stable at time of d/c.  Dictated # [id number]   Illene Regulus [place] [facility] (o) [phone]; (c) [phone] Call-grp - [facility]  Tele: [phone]  03/26/2012, 7:48 AM

## 2012-03-26 NOTE — Progress Notes (Signed)
condum cath removed at this time; d/c instructions given to daughter; pt to d/c home with daughter.

## 2012-03-27 ENCOUNTER — Telehealth: Payer: Self-pay | Admitting: General Practice

## 2012-03-27 NOTE — Telephone Encounter (Signed)
LMOM for patient's dtr to call office.

## 2012-03-27 NOTE — Telephone Encounter (Signed)
Transitional Care Call:  Spoke directly with patient.  Patient stated that he is doing well but is having problems with memory.  Pt denied having questions in regard to hospital DC instructions.  Pt did state that he uses eye drops but did not know if he was taking Ceftin.  According to patient home health has yet to contact him.  Patient did not know whether or not he could get to f/u appointment with Dr. Debby Bud but would try to get one of his grandchildren to bring him.  Patient denied having any questions that he wanted directed to Dr. Debby Bud.  F/U appointment made for 1/8.

## 2012-03-29 LAB — CULTURE, BLOOD (ROUTINE X 2)
Culture: NO GROWTH
Culture: NO GROWTH

## 2012-03-30 ENCOUNTER — Telehealth: Payer: Self-pay | Admitting: General Practice

## 2012-03-30 NOTE — Telephone Encounter (Signed)
Dtr called to report that patient has a rash on both arms and thought it might be related to taking Ceftin.  Dtr says that pt stopped taking the Ceftin Sunday.  No nausea, vomiting or diarrhea reported.

## 2012-04-01 ENCOUNTER — Ambulatory Visit: Payer: Medicare Other | Admitting: Internal Medicine

## 2012-04-01 DIAGNOSIS — Z0289 Encounter for other administrative examinations: Secondary | ICD-10-CM

## 2012-04-09 ENCOUNTER — Telehealth: Payer: Self-pay | Admitting: *Deleted

## 2012-04-09 NOTE — Telephone Encounter (Signed)
Olegario Messier, home health nurse with [facility], called requesting 2 more home health visits for pt education. Please advise.

## 2012-04-10 NOTE — Telephone Encounter (Signed)
Rayna Sexton, home health nurse with [facility], left a detailed message giving verbal ok for 2 more home health visits for pt education. [phone])

## 2012-04-10 NOTE — Telephone Encounter (Signed)
[place] 

## 2012-06-03 ENCOUNTER — Telehealth: Payer: Self-pay | Admitting: Internal Medicine

## 2012-06-03 NOTE — Telephone Encounter (Signed)
The patient's daugthter called the triage line hoping to get discuss the patient's care.  The callback number for the patient's daughter [name]) is [phone]

## 2012-06-04 NOTE — Telephone Encounter (Signed)
Phone call to daughter(Donna) [phone]. LM for her to call back re: Mr Stryker.

## 2012-06-05 NOTE — Telephone Encounter (Signed)
Returned call to pt's daughter(Donna [phone]). She is the caretaker for pt. She is going to be away from Apr 18 -22, 2014 and is needing pt to be referred to respite care facility while she is away. Pt does not walk well and needs his eye drops BID. Please advise.

## 2012-06-05 NOTE — Telephone Encounter (Signed)
I called and left a message for pt's daughter, [name], to let her know per Dr Debby Bud if pt is not a hospice pt he does not know of any respite care facilities for admission. On the message I let her know if she has further questions or concerns to call us back.

## 2012-06-05 NOTE — Telephone Encounter (Signed)
If not a hospice patient I do not know of any respite care facilities for admission. sorry

## 2012-06-15 ENCOUNTER — Emergency Department (HOSPITAL_COMMUNITY): Payer: Medicare Other

## 2012-06-15 ENCOUNTER — Encounter (HOSPITAL_COMMUNITY): Payer: Self-pay | Admitting: Emergency Medicine

## 2012-06-15 ENCOUNTER — Other Ambulatory Visit: Payer: Self-pay

## 2012-06-15 ENCOUNTER — Inpatient Hospital Stay (HOSPITAL_COMMUNITY)
Admission: EM | Admit: 2012-06-15 | Discharge: 2012-06-25 | DRG: 871 | Disposition: A | Discharge status: Home Health | Payer: Medicare Other | Attending: Internal Medicine | Admitting: Internal Medicine

## 2012-06-15 DIAGNOSIS — A4159 Other Gram-negative sepsis: Principal | ICD-10-CM | POA: Diagnosis present

## 2012-06-15 DIAGNOSIS — R6510 Systemic inflammatory response syndrome (SIRS) of non-infectious origin without acute organ dysfunction: Secondary | ICD-10-CM | POA: Diagnosis present

## 2012-06-15 DIAGNOSIS — H547 Unspecified visual loss: Secondary | ICD-10-CM | POA: Diagnosis present

## 2012-06-15 DIAGNOSIS — H409 Unspecified glaucoma: Secondary | ICD-10-CM

## 2012-06-15 DIAGNOSIS — J189 Pneumonia, unspecified organism: Secondary | ICD-10-CM

## 2012-06-15 DIAGNOSIS — F0390 Unspecified dementia without behavioral disturbance: Secondary | ICD-10-CM | POA: Diagnosis present

## 2012-06-15 DIAGNOSIS — H5440 Blindness, one eye, unspecified eye: Secondary | ICD-10-CM | POA: Diagnosis present

## 2012-06-15 DIAGNOSIS — J9600 Acute respiratory failure, unspecified whether with hypoxia or hypercapnia: Secondary | ICD-10-CM | POA: Diagnosis not present

## 2012-06-15 DIAGNOSIS — E876 Hypokalemia: Secondary | ICD-10-CM

## 2012-06-15 DIAGNOSIS — R338 Other retention of urine: Secondary | ICD-10-CM

## 2012-06-15 DIAGNOSIS — E46 Unspecified protein-calorie malnutrition: Secondary | ICD-10-CM | POA: Diagnosis present

## 2012-06-15 DIAGNOSIS — Z79899 Other long term (current) drug therapy: Secondary | ICD-10-CM

## 2012-06-15 DIAGNOSIS — N390 Urinary tract infection, site not specified: Secondary | ICD-10-CM | POA: Diagnosis present

## 2012-06-15 DIAGNOSIS — Z681 Body mass index (BMI) 19 or less, adult: Secondary | ICD-10-CM

## 2012-06-15 DIAGNOSIS — I10 Essential (primary) hypertension: Secondary | ICD-10-CM

## 2012-06-15 DIAGNOSIS — R410 Disorientation, unspecified: Secondary | ICD-10-CM

## 2012-06-15 DIAGNOSIS — R634 Abnormal weight loss: Secondary | ICD-10-CM | POA: Diagnosis present

## 2012-06-15 DIAGNOSIS — D509 Iron deficiency anemia, unspecified: Secondary | ICD-10-CM | POA: Diagnosis present

## 2012-06-15 DIAGNOSIS — R627 Adult failure to thrive: Secondary | ICD-10-CM | POA: Diagnosis present

## 2012-06-15 DIAGNOSIS — A419 Sepsis, unspecified organism: Secondary | ICD-10-CM

## 2012-06-15 DIAGNOSIS — N401 Enlarged prostate with lower urinary tract symptoms: Secondary | ICD-10-CM

## 2012-06-15 DIAGNOSIS — N138 Other obstructive and reflux uropathy: Secondary | ICD-10-CM | POA: Diagnosis present

## 2012-06-15 DIAGNOSIS — D638 Anemia in other chronic diseases classified elsewhere: Secondary | ICD-10-CM | POA: Diagnosis present

## 2012-06-15 DIAGNOSIS — I469 Cardiac arrest, cause unspecified: Secondary | ICD-10-CM

## 2012-06-15 DIAGNOSIS — R339 Retention of urine, unspecified: Secondary | ICD-10-CM

## 2012-06-15 DIAGNOSIS — R531 Weakness: Secondary | ICD-10-CM | POA: Diagnosis present

## 2012-06-15 DIAGNOSIS — N39490 Overflow incontinence: Secondary | ICD-10-CM | POA: Diagnosis not present

## 2012-06-15 LAB — CBC WITH DIFFERENTIAL/PLATELET
Eosinophils Absolute: 0 10*3/uL (ref 0.0–0.7)
HCT: 28.1 % — ABNORMAL LOW (ref 39.0–52.0)
Hemoglobin: 9.9 g/dL — ABNORMAL LOW (ref 13.0–17.0)
Lymphs Abs: 0.2 10*3/uL — ABNORMAL LOW (ref 0.7–4.0)
MCH: 29.2 pg (ref 26.0–34.0)
MCV: 82.9 fL (ref 78.0–100.0)
Monocytes Absolute: 0.8 10*3/uL (ref 0.1–1.0)
Monocytes Relative: 8 % (ref 3–12)
Neutrophils Relative %: 90 % — ABNORMAL HIGH (ref 43–77)
RBC: 3.39 MIL/uL — ABNORMAL LOW (ref 4.22–5.81)

## 2012-06-15 LAB — POCT I-STAT TROPONIN I: Troponin i, poc: 0.02 ng/mL (ref 0.00–0.08)

## 2012-06-15 LAB — URINALYSIS, MICROSCOPIC ONLY
Glucose, UA: NEGATIVE mg/dL
Protein, ur: 30 mg/dL — AB
Specific Gravity, Urine: 1.014 (ref 1.005–1.030)

## 2012-06-15 LAB — CG4 I-STAT (LACTIC ACID): Lactic Acid, Venous: 5.87 mmol/L — ABNORMAL HIGH (ref 0.5–2.2)

## 2012-06-15 LAB — BASIC METABOLIC PANEL
CO2: 23 mEq/L (ref 19–32)
Chloride: 105 mEq/L (ref 96–112)
Glucose, Bld: 119 mg/dL — ABNORMAL HIGH (ref 70–99)
Potassium: 5 mEq/L (ref 3.5–5.1)
Sodium: 141 mEq/L (ref 135–145)

## 2012-06-15 MED ORDER — LEVOFLOXACIN IN D5W 750 MG/150ML IV SOLN: 750.0000 mg | INTRAVENOUS | Status: DC

## 2012-06-15 MED ORDER — ONDANSETRON HCL 4 MG/2ML IJ SOLN: 4.0000 mg | Freq: Four times a day (QID) | INTRAMUSCULAR | Status: DC | PRN

## 2012-06-15 MED ORDER — ACETAMINOPHEN 650 MG RE SUPP: 650.0000 mg | Freq: Four times a day (QID) | RECTAL | Status: DC | PRN

## 2012-06-15 MED ORDER — SODIUM CHLORIDE 0.9 % IV SOLN
1000.0000 mL | INTRAVENOUS | Status: DC
  Administered 2012-06-15: 1000 mL via INTRAVENOUS

## 2012-06-15 MED ORDER — SODIUM CHLORIDE 0.9 % IV SOLN
INTRAVENOUS | Status: DC
  Administered 2012-06-15 – 2012-06-23 (×6): via INTRAVENOUS
  Administered 2012-06-23: 1000 mL via INTRAVENOUS
  Administered 2012-06-24 – 2012-06-25 (×3): via INTRAVENOUS

## 2012-06-15 MED ORDER — LATANOPROST 0.005 % OP SOLN
1.0000 [drp] | Freq: Every day | OPHTHALMIC | Status: DC
  Administered 2012-06-15 – 2012-06-24 (×10): 1 [drp] via OPHTHALMIC
  Filled 2012-06-15 (×2): qty 2.5

## 2012-06-15 MED ORDER — LEVOBUNOLOL HCL 0.5 % OP SOLN
1.0000 [drp] | Freq: Every day | OPHTHALMIC | Status: DC
  Administered 2012-06-16 – 2012-06-25 (×10): 1 [drp] via OPHTHALMIC
  Filled 2012-06-15 (×2): qty 5

## 2012-06-15 MED ORDER — DEXTROSE 5 % IV SOLN
1.0000 g | Freq: Three times a day (TID) | INTRAVENOUS | Status: DC
  Filled 2012-06-15 (×2): qty 1

## 2012-06-15 MED ORDER — VANCOMYCIN HCL IN DEXTROSE 1-5 GM/200ML-% IV SOLN
1000.0000 mg | INTRAVENOUS | Status: DC
  Administered 2012-06-15: 1000 mg via INTRAVENOUS
  Filled 2012-06-15: qty 200

## 2012-06-15 MED ORDER — TIMOLOL MALEATE 0.5 % OP SOLG: 1.0000 [drp] | Freq: Every day | OPHTHALMIC | Status: DC

## 2012-06-15 MED ORDER — ONDANSETRON HCL 4 MG PO TABS: 4.0000 mg | ORAL_TABLET | Freq: Four times a day (QID) | ORAL | Status: DC | PRN

## 2012-06-15 MED ORDER — DEXTROSE 5 % IV SOLN
1.0000 g | Freq: Every day | INTRAVENOUS | Status: DC
  Administered 2012-06-16 – 2012-06-25 (×10): 1 g via INTRAVENOUS
  Filled 2012-06-15 (×11): qty 10

## 2012-06-15 MED ORDER — LEVOFLOXACIN IN D5W 750 MG/150ML IV SOLN
750.0000 mg | INTRAVENOUS | Status: DC
  Administered 2012-06-15: 750 mg via INTRAVENOUS
  Filled 2012-06-15: qty 150

## 2012-06-15 MED ORDER — SODIUM CHLORIDE 0.9 % IV SOLN
1000.0000 mL | Freq: Once | INTRAVENOUS | Status: AC
  Administered 2012-06-15: 1000 mL via INTRAVENOUS

## 2012-06-15 MED ORDER — CEFEPIME HCL 2 G IJ SOLR
2.0000 g | Freq: Two times a day (BID) | INTRAMUSCULAR | Status: DC
  Administered 2012-06-15: 2 g via INTRAVENOUS
  Filled 2012-06-15 (×2): qty 2

## 2012-06-15 MED ORDER — SODIUM CHLORIDE 0.9 % IJ SOLN
3.0000 mL | Freq: Two times a day (BID) | INTRAMUSCULAR | Status: DC
  Administered 2012-06-15 – 2012-06-23 (×10): 3 mL via INTRAVENOUS

## 2012-06-15 MED ORDER — ACETAMINOPHEN 500 MG PO TABS
1000.0000 mg | ORAL_TABLET | Freq: Once | ORAL | Status: AC
  Administered 2012-06-15: 1000 mg via ORAL
  Filled 2012-06-15: qty 2

## 2012-06-15 MED ORDER — ACETAMINOPHEN 325 MG PO TABS: 650.0000 mg | ORAL_TABLET | Freq: Four times a day (QID) | ORAL | Status: DC | PRN

## 2012-06-15 MED ORDER — ENOXAPARIN SODIUM 40 MG/0.4ML ~~LOC~~ SOLN
40.0000 mg | SUBCUTANEOUS | Status: DC
  Administered 2012-06-15 – 2012-06-23 (×9): 40 mg via SUBCUTANEOUS
  Filled 2012-06-15 (×11): qty 0.4

## 2012-06-15 NOTE — ED Notes (Signed)
Per EMS-pt c/o of SOB this morning. States that he recently had pneumonia and UTI. Has been febrile per family at 47. Hx Dementia.

## 2012-06-15 NOTE — ED Notes (Signed)
HYQ:[id number]<IO> Expected date:06/15/12<BR> Expected time: 1:07 PM<BR> Means of arrival:Ambulance<BR> Comments:<BR> 89yoM fever

## 2012-06-15 NOTE — Progress Notes (Signed)
[facility] ED CM noted CM consult for lives with family, medication concerns and possible home services  Pt has hx dementia (per EDP progress note and previous admission notes) lives with daughter Hazel Wrinkle [phone] and has used [facility] in [facility]x for home services (chosen by dtr in 2013) Pt has [employer] coverage therefore is not [facility] program candidate for medication assistance

## 2012-06-15 NOTE — H&P (Addendum)
[facility] History and Physical  [name] XBJ:[id number] DOB: 1922/04/02 DOA: 06/15/2012  Referring physician: Dr.kOHUT PCP: Illene Regulus, MD  Specialists: none  Chief Complaint: Fever and shortness of breath  HPI: [name] is a 79 y.o. male with past medical history as listed below and ?Dementia lives at home with his daughter and presents with above complaints. The history is obtained from EDP and chart review. Per EMS of family called for fever and shortness of breath. he denies cough, dysuria, shortness of breath chest pain, nausea or vomiting, diarrhea melena and no hematochezia. He states he does not know why he was brought to the ED. In the ED he was found to be febrile to 102.8 BP stable at 142/53, and tachycardic up to the 120s-improved with IV F to the 90s, chest x-ray was done and came back negative for infiltrates and lactic acid level was elevated at 5.87. Urinalysis was done and is consistent with a UTI. She is admitted for further evaluation and management.   Review of Systems: As per history of present illness, otherwise unobtainable secondary to patient's mental status  Past Medical History  Diagnosis Date  . Unspecified visual loss   . Blindness of right eye   . Anemia, iron deficiency   . Glaucoma(365)   . Hearing decreased    Past Surgical History  Procedure Laterality Date  . Appendectomy  Age 83  . Cataract extraction      Left 1989, Right 1990   Social History:  reports that he has never smoked. He has never used smokeless tobacco. He reports that he does not drink alcohol or use illicit drugs. where does patient live--home with daughter   No Known Allergies  Family History  Problem Relation Age of Onset  . Glaucoma Daughter   . Diabetes Father   . Cancer - Other Father     Bone  . Cancer - Other Brother     Liver    Prior to Admission medications   Medication Sig Start Date End Date Taking? Authorizing Provider  latanoprost  (XALATAN) 0.005 % ophthalmic solution Place 1 drop into the left eye at bedtime.    Yes Historical Provider, MD  levobunolol (BETAGAN) 0.5 % ophthalmic solution Place 1 drop into the left eye daily.   Yes Historical Provider, MD  acetaminophen (TYLENOL) 650 MG CR tablet Take 650 mg by mouth once.    Historical Provider, MD  timolol (TIMOPTIC-XR) 0.5 % ophthalmic gel-forming Place 1 drop into both eyes daily.      Historical Provider, MD   Physical Exam: Filed Vitals:   06/15/12 1345 06/15/12 1415 06/15/12 1500 06/15/12 1600  BP: 142/53 138/60  127/64  Pulse: 118     Temp: 102.8 F (39.3 C)     TempSrc: Rectal     Resp: 25 21    Height:   6' 0.05" (1.83 m)   Weight:   61.236 kg (135 lb)   SpO2: 97%       Constitutional: Vital signs reviewed.  Patient is a well-developed and well-nourished  in no acute distress and cooperative with exam. Alert and oriented x3. He is blind in the right eye  Head: Normocephalic and atraumatic Ear: TM normal bilaterally Mouth: no erythema or exudates, dry MM Eyes: Left with EOMI, Blind in right eye Right eye  No scleral icterus.  Neck: Supple, Trachea midline normal ROM, No JVD, mass, thyromegaly, or carotid bruit present.  Cardiovascular: RRR, S1 normal, S2 normal,  no MRG, pulses symmetric and intact bilaterally Pulmonary/Chest: CTAB, no wheezes, rales, or rhonchi Abdominal: Soft. Non-tender, non-distended, bowel sounds are normal, no masses, organomegaly, or guarding present.  GU: no CVA tenderness  Extremities: No cyanosis and no edema  Neurological: A&O x1, Strength is normal and symmetric bilaterally, cranial nerve II-XII are grossly intact, no focal motor deficit, sensory intact to light touch bilaterally.  Skin: Warm, dry and intact. No rash.  Psychiatric: Normal mood and affect.   Labs on Admission:  Basic Metabolic Panel:  Recent Labs Lab 06/15/12 1432  NA 141  K 5.0  CL 105  CO2 23  GLUCOSE 119*  BUN 19  CREATININE 1.33  CALCIUM  8.7   Liver Function Tests: No results found for this basename: AST, ALT, ALKPHOS, BILITOT, PROT, ALBUMIN,  in the last 168 hours No results found for this basename: LIPASE, AMYLASE,  in the last 168 hours No results found for this basename: AMMONIA,  in the last 168 hours CBC:  Recent Labs Lab 06/15/12 1432  WBC 10.1  NEUTROABS 9.1*  HGB 9.9*  HCT 28.1*  MCV 82.9  PLT 136*   Cardiac Enzymes: No results found for this basename: CKTOTAL, CKMB, CKMBINDEX, TROPONINI,  in the last 168 hours  BNP (last 3 results) No results found for this basename: PROBNP,  in the last 8760 hours CBG: No results found for this basename: GLUCAP,  in the last 168 hours  Radiological Exams on Admission: Dg Chest 2 View  06/15/2012  *RADIOLOGY REPORT*  Clinical Data: Shortness of breath, weakness  CHEST - 2 VIEW  Comparison: 03/24/2012  Findings: Upper normal heart size. Mediastinal contours and pulmonary vascularity normal. Skin fold projects over right chest. Lungs hyperinflated but clear. No pleural effusion or pneumothorax. Inferior acromial spur formation at left shoulder. Osseous demineralization.  IMPRESSION: Hyperinflated lungs without acute infiltrate. Inferior left acromial spur formation which may predispose the patient to rotator cuff pathology.   Original Report Authenticated By: Ulyses Southward, M.D.     EKG: Independently reviewed.-sinus tachycardia at 109 with no acute ischemic changes  Assessment/Plan Principal Problem:  UTI -As discussed above, obtain blood and urine cultures -Empiric antibiotics with Rocephin and follow. Active Problems: SIRS (systemic inflammatory response syndrome) -Secondary to #1, chest x-ray negative for acute infiltrates -His lactic acid level is elevated-about 6, will follow recheck also obtain  proalcitonin follow and further manage accordingly -Monitor closely in step down   ANEMIA, IRON DEFICIENCY -Hemoglobin stable, follow and recheck  Weakness  generalized   -Secondary to above, consult PT OT History of glaucoma -Continue outpatient medications   BLINDNESS, RIGHT EYE/VISUAL ACUITY, DECREASED, LEFT EYE      Code Status: presumed full Family Communication: call daughter at [phone], left message, also called [phone]-o response  Disposition Plan: admit to step down  Time spent:>80mins  Kela Millin [facility] Pager [phone]  If 7PM-7AM, please contact night-coverage [email or web address] Password [facility] 06/15/2012, 6:07 PM

## 2012-06-15 NOTE — ED Provider Notes (Signed)
History     CSN: [id number]  Arrival date & time 06/15/12  1325   First MD Initiated Contact with Patient 06/15/12 1354      Chief Complaint  Patient presents with  . Shortness of Breath    (Consider location/radiation/quality/duration/timing/severity/associated sxs/prior treatment) HPI Comments: [name] is a 79 y.o. male with a history of dementia presents emergency department via EMS for worsening shortness of breath and fever per family.  Unable to obtain appropriate history from patient due to the L5 caveat and baseline dementia. Med records reviewed showing hospital admission 12/30-03/26/2012 for CAP, hypokalemia, UTI and worsened confusion. Pt currently lives at home with his wife & daughter.   The history is provided by the patient and medical records.    Past Medical History  Diagnosis Date  . Unspecified visual loss   . Blindness of right eye   . Anemia, iron deficiency   . Glaucoma(365)   . Hearing decreased     Past Surgical History  Procedure Laterality Date  . Appendectomy  Age 36  . Cataract extraction      Left 1989, Right 1990    Family History  Problem Relation Age of Onset  . Glaucoma Daughter   . Diabetes Father   . Cancer - Other Father     Bone  . Cancer - Other Brother     Liver    History  Substance Use Topics  . Smoking status: Never Smoker   . Smokeless tobacco: Never Used  . Alcohol Use: No      Review of Systems  Unable to perform ROS: Dementia    Allergies  Review of patient's allergies indicates no known allergies.  Home Medications   Current Outpatient Rx  Name  Route  Sig  Dispense  Refill  . acetaminophen (TYLENOL) 650 MG CR tablet   Oral   Take 650 mg by mouth once.         . cefUROXime (CEFTIN) 250 MG tablet   Oral   Take 1 tablet (250 mg total) by mouth 2 (two) times daily.   10 tablet   0   . latanoprost (XALATAN) 0.005 % ophthalmic solution   Both Eyes   Place 1 drop into both eyes daily.          . timolol (TIMOPTIC-XR) 0.5 % ophthalmic gel-forming   Both Eyes   Place 1 drop into both eyes daily.             BP 139/62  Pulse 118  Temp(Src) 102.8 F (39.3 C) (Rectal)  Resp 20  SpO2 97%  Physical Exam  Nursing note and vitals reviewed. Constitutional: He is oriented to person, place, and time. No distress.  Cachectic, febrile  HENT:  Head: Normocephalic and atraumatic.  Eyes: Conjunctivae and EOM are normal.  Neck: Normal range of motion.  Cardiovascular:  Tachycardic, intact distal pulses, no pitting edema  Pulmonary/Chest: Effort normal.  LCAB, mild respiratory distress with accessory muscle use. 97% on RA  Abdominal:  Suprapubic mild tenderness/fullness. No peritoneal signs   Genitourinary:  Non circumcised penis with gross hematuria.  Urine Characteristics  -  Bladder Scan Volume (mL):  395 mL c/w retention   Musculoskeletal: Normal range of motion.  Neurological: He is alert and oriented to person, place, and time.  Skin: Skin is warm and dry. No rash noted.  Psychiatric: He has a normal mood and affect. His behavior is normal.    ED Course  Procedures (including  critical care time)  Labs Reviewed  BASIC METABOLIC PANEL - Abnormal; Notable for the following:    Glucose, Bld 119 (*)    GFR calc non Af Amer 46 (*)    GFR calc Af Amer 53 (*)    All other components within normal limits  CBC WITH DIFFERENTIAL - Abnormal; Notable for the following:    RBC 3.39 (*)    Hemoglobin 9.9 (*)    HCT 28.1 (*)    Platelets 136 (*)    Neutrophils Relative 90 (*)    Neutro Abs 9.1 (*)    Lymphocytes Relative 2 (*)    Lymphs Abs 0.2 (*)    All other components within normal limits  URINALYSIS, MICROSCOPIC ONLY - Abnormal; Notable for the following:    APPearance TURBID (*)    Hgb urine dipstick LARGE (*)    Protein, ur 30 (*)    Leukocytes, UA LARGE (*)    Bacteria, UA MANY (*)    Squamous Epithelial / LPF FEW (*)    All other components within normal  limits  CG4 I-STAT (LACTIC ACID) - Abnormal; Notable for the following:    Lactic Acid, Venous 5.87 (*)    All other components within normal limits  CULTURE, BLOOD (ROUTINE X 2)  CULTURE, BLOOD (ROUTINE X 2)  URINE CULTURE  LACTIC ACID, PLASMA  POCT I-STAT TROPONIN I   Dg Chest 2 View  06/15/2012  *RADIOLOGY REPORT*  Clinical Data: Shortness of breath, weakness  CHEST - 2 VIEW  Comparison: 03/24/2012  Findings: Upper normal heart size. Mediastinal contours and pulmonary vascularity normal. Skin fold projects over right chest. Lungs hyperinflated but clear. No pleural effusion or pneumothorax. Inferior acromial spur formation at left shoulder. Osseous demineralization.  IMPRESSION: Hyperinflated lungs without acute infiltrate. Inferior left acromial spur formation which may predispose the patient to rotator cuff pathology.   Original Report Authenticated By: Ulyses Southward, M.D.    Meds ordered this encounter  Medications  . 0.9 %  sodium chloride infusion    Sig:    Followed by  . 0.9 %  sodium chloride infusion    Sig:   . DISCONTD: ceFEPIme (MAXIPIME) 1 g in dextrose 5 % 50 mL IVPB    Sig:   . DISCONTD: levofloxacin (LEVAQUIN) IVPB 750 mg    Sig:   . acetaminophen (TYLENOL) tablet 1,000 mg    Sig:   . levobunolol (BETAGAN) 0.5 % ophthalmic solution    Sig: Place 1 drop into the left eye daily.  Marland Kitchen DISCONTD: ceFEPIme (MAXIPIME) 1 g in dextrose 5 % 50 mL IVPB    Sig:   . DISCONTD: levofloxacin (LEVAQUIN) IVPB 750 mg    Sig:   . vancomycin (VANCOCIN) IVPB 1000 mg/200 mL premix    Sig:     X 8 days  . ceFEPIme (MAXIPIME) 2 g in dextrose 5 % 50 mL IVPB    Sig:   . levofloxacin (LEVAQUIN) IVPB 750 mg    Sig:      No diagnosis found.   Date: 06/15/2012  Rate: 109  Rhythm: sinus tachycardia  QRS Axis: left  Intervals: normal  ST/T Wave abnormalities: ST dep & flat T in ant/lat/inf leads  Conduction Disutrbances:none  Narrative Interpretation:   Old EKG Reviewed: changes  noted and evaluated with attending, comparison ecg from 2009  3:11 PM- lactic acid of 5.87, started on HCAP abx (recent hosp admission for CAP 12/30-03/26/2012)  MDM  Sepsis, UTI  79 year old male presents  to the emergency department and from home febrile, tachycardic and normotensive.  Lactic acid of 5.87.  Patient started on IV antibiotics & IV fluids.  Imaging reviewed with no evidence of acute infiltrate on x-ray.  The patient appears reasonably stabilized for admission considering the current resources, flow, and capabilities available in the ED at this time, and I doubt any other [facility] requiring further screening and/or treatment in the ED prior to admission.  Case seen and discussed with attending who agrees with plan to admit patient.         Jaci Carrel, [place] 06/15/12 [phone]

## 2012-06-15 NOTE — Progress Notes (Signed)
ANTIBIOTIC CONSULT NOTE - INITIAL  Pharmacy Consult for Vancomycin, adjusted Cefepime/Levaquin based on renal fx Indication: suspected pna  No Known Allergies  Patient Measurements: Height: 6' 0.05" (183 cm) (Documented 3/24) Weight: 135 lb (61.236 kg) (documented per phone conversation 3/24) IBW/kg (Calculated) : 77.71 Adjusted Body Weight:   Vital Signs: Temp: 102.8 F (39.3 C) (03/24 1345) Temp src: Rectal (03/24 1345) BP: 138/60 mmHg (03/24 1415) Pulse Rate: 118 (03/24 1345) Intake/Output from previous day:   Intake/Output from this shift:    Labs:  Recent Labs  06/15/12 1432  WBC 10.1  HGB 9.9*  PLT 136*  CREATININE 1.33   Estimated Creatinine Clearance: 33.2 ml/min (by C-G formula based on Cr of 1.33). No results found for this basename: VANCOTROUGH, VANCOPEAK, VANCORANDOM, GENTTROUGH, GENTPEAK, GENTRANDOM, TOBRATROUGH, TOBRAPEAK, TOBRARND, AMIKACINPEAK, AMIKACINTROU, AMIKACIN,  in the last 72 hours   Microbiology: No results found for this or any previous visit (from the past 720 hour(s)).  Medical History: Past Medical History  Diagnosis Date  . Unspecified visual loss   . Blindness of right eye   . Anemia, iron deficiency   . Glaucoma(365)   . Hearing decreased     Medications:  Scheduled:  . [COMPLETED] acetaminophen  1,000 mg Oral Once   Infusions:  . [COMPLETED] sodium chloride 1,000 mL (06/15/12 1444)   Followed by  . sodium chloride 1,000 mL (06/15/12 1444)  . ceFEPime (MAXIPIME) IV    . [START ON 06/17/2012] levofloxacin (LEVAQUIN) IV    . vancomycin    . [DISCONTINUED] ceFEPime (MAXIPIME) IV    . [DISCONTINUED] ceFEPime (MAXIPIME) IV    . [DISCONTINUED] levofloxacin (LEVAQUIN) IV    . [DISCONTINUED] levofloxacin (LEVAQUIN) IV 750 mg (06/15/12 1523)   PRN:  Assessment: 79 yo M with hx of dementia, recent admission HCAP. Now with SOB,and fever  Goal of Therapy:  Vancomycin trough level 15-20 mcg/ml  Plan:  Vancomycin 1000 MG IV q  24 hours Change cefepime to 2GM IV q 12 hours(based on renal fx) Change Levaquin 750mg  IV q 48hours(based on renal fx) Measure antibiotic drug levels at steady state Follow up culture results  Loletta Specter 06/15/2012,3:52 PM

## 2012-06-16 DIAGNOSIS — R6510 Systemic inflammatory response syndrome (SIRS) of non-infectious origin without acute organ dysfunction: Secondary | ICD-10-CM

## 2012-06-16 DIAGNOSIS — A419 Sepsis, unspecified organism: Secondary | ICD-10-CM

## 2012-06-16 DIAGNOSIS — F29 Unspecified psychosis not due to a substance or known physiological condition: Secondary | ICD-10-CM

## 2012-06-16 DIAGNOSIS — D509 Iron deficiency anemia, unspecified: Secondary | ICD-10-CM

## 2012-06-16 DIAGNOSIS — N390 Urinary tract infection, site not specified: Secondary | ICD-10-CM

## 2012-06-16 DIAGNOSIS — R5383 Other fatigue: Secondary | ICD-10-CM

## 2012-06-16 DIAGNOSIS — I10 Essential (primary) hypertension: Secondary | ICD-10-CM

## 2012-06-16 LAB — IRON AND TIBC
Iron: 36 ug/dL — ABNORMAL LOW (ref 42–135)
Saturation Ratios: 30 % (ref 20–55)
TIBC: 120 ug/dL — ABNORMAL LOW (ref 215–435)
UIBC: 84 ug/dL — ABNORMAL LOW (ref 125–400)

## 2012-06-16 LAB — BASIC METABOLIC PANEL
BUN: 13 mg/dL (ref 6–23)
Creatinine, Ser: 1.13 mg/dL (ref 0.50–1.35)
GFR calc Af Amer: 65 mL/min — ABNORMAL LOW (ref >90)
GFR calc non Af Amer: 56 mL/min — ABNORMAL LOW (ref >90)
Glucose, Bld: 76 mg/dL (ref 70–99)
Potassium: 3.9 mEq/L (ref 3.5–5.1)

## 2012-06-16 LAB — RETICULOCYTES: Retic Count, Absolute: 23.9 10*3/uL (ref 19.0–186.0)

## 2012-06-16 LAB — CBC
HCT: 28.5 % — ABNORMAL LOW (ref 39.0–52.0)
Hemoglobin: 9.8 g/dL — ABNORMAL LOW (ref 13.0–17.0)
MCH: 28.5 pg (ref 26.0–34.0)
RBC: 3.44 MIL/uL — ABNORMAL LOW (ref 4.22–5.81)

## 2012-06-16 LAB — FERRITIN: Ferritin: 423 ng/mL — ABNORMAL HIGH (ref 22–322)

## 2012-06-16 LAB — TROPONIN I: Troponin I: <0.3 ng/mL (ref <0.30)

## 2012-06-16 LAB — FOLATE: Folate: 17.2 ng/mL

## 2012-06-16 MED ORDER — LORAZEPAM 0.5 MG PO TABS: 0.5000 mg | ORAL_TABLET | ORAL | Status: DC | PRN

## 2012-06-16 MED ORDER — ADULT MULTIVITAMIN W/MINERALS CH
1.0000 | ORAL_TABLET | Freq: Every day | ORAL | Status: DC
  Administered 2012-06-16 – 2012-06-25 (×10): 1 via ORAL
  Filled 2012-06-16 (×10): qty 1

## 2012-06-16 MED ORDER — ENSURE COMPLETE PO LIQD
237.0000 mL | Freq: Two times a day (BID) | ORAL | Status: DC
  Administered 2012-06-16 – 2012-06-22 (×9): 237 mL via ORAL

## 2012-06-16 NOTE — Progress Notes (Signed)
Subjective: Mr. Hellmer feels better and looks better except he still has penile pain and now has marked swelling of the distal penis.  Objective: Lab: Lab Results  Component Value Date   WBC 18.0* 06/16/2012   HGB 9.8* 06/16/2012   HCT 28.5* 06/16/2012   MCV 82.8 06/16/2012   PLT 120* 06/16/2012   BMET    Component Value Date/Time   NA 139 06/16/2012 0450   K 3.9 06/16/2012 0450   CL 109 06/16/2012 0450   CO2 24 06/16/2012 0450   GLUCOSE 76 06/16/2012 0450   BUN 13 06/16/2012 0450   CREATININE 1.13 06/16/2012 0450   CALCIUM 7.9* 06/16/2012 0450   GFRNONAA 56* 06/16/2012 0450   GFRAA 65* 06/16/2012 0450     Imaging:  Scheduled Meds: . cefTRIAXone (ROCEPHIN)  IV  1 g Intravenous Daily  . enoxaparin (LOVENOX) injection  40 mg Subcutaneous Q24H  . feeding supplement  237 mL Oral BID BM  . latanoprost  1 drop Left Eye QHS  . levobunolol  1 drop Left Eye Daily  . multivitamin with minerals  1 tablet Oral Daily  . sodium chloride  3 mL Intravenous Q12H   Continuous Infusions: . sodium chloride 100 mL/hr at 06/15/12 2348   PRN Meds:.acetaminophen, acetaminophen, LORazepam, ondansetron (ZOFRAN) IV, ondansetron   Physical Exam: Filed Vitals:   06/16/12 1644  BP: 141/49  Pulse: 70  Temp:   Resp: 16   Elderly AA man sitting in a chair in no acute distress GU - distal aspect of the penis with marked swelling. No blood at the meatus     Assessment/Plan: 6. GU - patient with penile swelling Plan - will consult with GU: local treatment, safety in removing the foley.   Late entry: spoke with GU on-call: based on my report to them - no need for intervention. Observation recommended with consideration of voiding trial AM 06/17/12  Illene Regulus [facility] (o) [phone]; (c) [phone] Call-grp - [facility]  Tele: [phone]  06/16/2012, 6:35 PM

## 2012-06-16 NOTE — Progress Notes (Signed)
INITIAL NUTRITION ASSESSMENT  DOCUMENTATION CODES Per approved criteria  -Underweight   INTERVENTION: Provide Ensure Complete po BID, each supplement provides 350 kcal and 13 grams of protein. Provide Multivitamin with minerals daily   NUTRITION DIAGNOSIS: Increased nutrient needs related to underweight and SIRS as evidenced by pt with BMI of 18.3   Goal: Pt to meet >/= 90% of their estimated nutrition needs  Monitor:  PO intake Wt  Reason for Assessment: Consult  79 y.o. male  Admitting Dx: Urinary tract infection, site not specified  ASSESSMENT: 79 year old male presented to the ED with Fever, weight loss, tachypnea, tachycardia and increased confusion. He is admitted with Urinary tract infection, possibly pyelonephritis with SIRS based on fever, tachycardia, tachypnea and chemical markers.  Pt unable to clearly/accurately answer questions at time of visit. Pt reports having a good appetite and was eating first meal since admission at time of visit. 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 Wt history shows gradual wt loss over 4 years, 13% wt loss in the past 2 years, and wt maintenance for the past 3 months.  Height: Ht Readings from Last 1 Encounters:  06/15/12 6' 0.05" (1.83 m)    Weight: Wt Readings from Last 1 Encounters:  06/15/12 135 lb (61.236 kg)    Ideal Body Weight: 178 lbs  % Ideal Body Weight: 76%  Wt Readings from Last 10 Encounters:  06/15/12 135 lb (61.236 kg)  03/23/12 135 lb 2.3 oz (61.3 kg)  04/27/10 155 lb (70.308 kg)  12/14/09 163 lb (73.936 kg)  02/14/09 165 lb (74.844 kg)  01/15/08 180 lb (81.647 kg)    Usual Body Weight: unknown  % Usual Body Weight: NA  BMI:  Body mass index is 18.29 kg/(m^2).  Estimated Nutritional Needs: Kcal: 1590-1840 Protein: 73-86 grams Fluid: 1.8-2.1 L  Nutrition Focused Physical Exam:  Subcutaneous Fat:  Orbital Region: wnl Upper Arm Region: moderate wasting Thoracic and Lumbar  Region: mild wasting  Muscle:  Temple Region: wnl Clavicle Bone Region: moderate wasting Clavicle and Acromion Bone Region: moderate wasting Scapular Bone Region: wnl Dorsal Hand: mild wasting Patellar Region: wnl Anterior Thigh Region: wnl Posterior Calf Region: wnl  Edema: not present  Skin: WDL  Diet Order: General  EDUCATION NEEDS: -No education needs identified at this time   Intake/Output Summary (Last 24 hours) at 06/16/12 1518 Last data filed at 06/16/12 1200  Gross per 24 hour  Intake   1270 ml  Output      0 ml  Net   1270 ml    Last BM: PTA  Labs:   Recent Labs Lab 06/15/12 1432 06/16/12 0450  NA 141 139  K 5.0 3.9  CL 105 109  CO2 23 24  BUN 19 13  CREATININE 1.33 1.13  CALCIUM 8.7 7.9*  GLUCOSE 119* 76    CBG (last 3)  No results found for this basename: GLUCAP,  in the last 72 hours  Scheduled Meds: . cefTRIAXone (ROCEPHIN)  IV  1 g Intravenous Daily  . enoxaparin (LOVENOX) injection  40 mg Subcutaneous Q24H  . latanoprost  1 drop Left Eye QHS  . levobunolol  1 drop Left Eye Daily  . sodium chloride  3 mL Intravenous Q12H    Continuous Infusions: . sodium chloride 100 mL/hr at 06/15/12 2348    Past Medical History  Diagnosis Date  . Unspecified visual loss   . Blindness of right eye   . Anemia, iron  deficiency   . Glaucoma(365)   . Hearing decreased     Past Surgical History  Procedure Laterality Date  . Appendectomy  Age 15  . Cataract extraction      Left 1989, Right 1990    Ian Malkin RD, LDN Inpatient Clinical Dietitian Pager: [phone] After Hours Pager: [phone]

## 2012-06-16 NOTE — Evaluation (Signed)
Physical Therapy Evaluation Patient Details Name: [name] MRN: [id number] DOB: 08-18-1922 Today's Date: 06/16/2012 Time: 4098-1191 PT Time Calculation (min): 20 min  PT Assessment / Plan / Recommendation Clinical Impression  Pt presents with UTI, fever and possible pyelonephritis w/ SIRS.  Note history of dementia.  Tolerated OOB and ambulation around half of unit with min assist (+2 for safety and line management).  Pt is unsteady and requires hands on assist at this time.  Per chart, pt may live with daughter, but it was unclear, however pt states he lives with spouse.  Pt will benefit from skilled PT in acute venue to address deficits.  PT recommends SNF at D/C to maximize pts safety and function.     PT Assessment  Patient needs continued PT services    Follow Up Recommendations  SNF;Supervision/Assistance - 24 hour    Does the patient have the potential to tolerate intense rehabilitation      Barriers to Discharge Decreased caregiver support      Equipment Recommendations  Rolling walker with 5" wheels    Recommendations for Other Services OT consult   Frequency Min 3X/week    Precautions / Restrictions Precautions Precautions: Fall Restrictions Weight Bearing Restrictions: No   Pertinent Vitals/Pain Some pain in LLE with amb      Mobility  Bed Mobility Bed Mobility: Supine to Sit Supine to Sit: 4: Min assist;With rails;HOB elevated Details for Bed Mobility Assistance: Some assist for trunk when getting into sitting position.  Pt able to manage LEs on his own with increased time and cues for hand placement to get closer to EOB.  Transfers Transfers: Sit to Stand;Stand to Sit Sit to Stand: 4: Min assist;3: Mod assist;From elevated surface;With upper extremity assist;From bed Stand to Sit: 4: Min assist;With upper extremity assist;With armrests;To chair/3-in-1 Details for Transfer Assistance: Assist to rise and steady with some posterior leaning initially and  cues for hand placement and safety as pt was somewhat impulsive to stand.  Ambulation/Gait Ambulation/Gait Assistance: 1: +2 Total assist Ambulation/Gait: Patient Percentage: 60% Ambulation Distance (Feet): 250 Feet Assistive device: Rolling walker Ambulation/Gait Assistance Details: Pt mostly min assist, however does require some assist to steady and maintain proper position inside of RW.  Noted that pt would veer with RW to one side or the other and he would step outside of RW.  Recommend +2 for line management as pt requires hands on assist.  Gait Pattern: Step-through pattern;Trunk flexed;Decreased stride length Gait velocity: decreased Stairs: No Wheelchair Mobility Wheelchair Mobility: No    Exercises     PT Diagnosis: Difficulty walking;Generalized weakness;Acute pain  PT Problem List: Decreased strength;Decreased activity tolerance;Decreased balance;Decreased mobility;Decreased coordination;Decreased cognition;Decreased knowledge of use of DME;Decreased safety awareness;Decreased knowledge of precautions;Pain PT Treatment Interventions: DME instruction;Gait training;Functional mobility training;Therapeutic activities;Therapeutic exercise;Balance training;Patient/family education   PT Goals Acute Rehab PT Goals PT Goal Formulation: With patient Time For Goal Achievement: 06/30/12 Potential to Achieve Goals: Good Pt will go Supine/Side to Sit: with supervision PT Goal: Supine/Side to Sit - Progress: Goal set today Pt will go Sit to Supine/Side: with supervision PT Goal: Sit to Supine/Side - Progress: Goal set today Pt will go Sit to Stand: with supervision PT Goal: Sit to Stand - Progress: Goal set today Pt will go Stand to Sit: with supervision PT Goal: Stand to Sit - Progress: Goal set today Pt will Ambulate: >150 feet;with supervision;with least restrictive assistive device PT Goal: Ambulate - Progress: Goal set today  Visit Information  Last  PT Received On:  06/16/12 Assistance Needed: +2 (safety/line management. )    Subjective Data  Subjective: I would love to get up.  Patient Stated Goal: n/a   Prior Functioning  Home Living Additional Comments: Pt states he lives at home with spouse, however notes state he may live with daughter? Pt with hx of dementia, therefore unsure of PLOF/home environment.  Prior Function Comments: Again, pt poor historian and unable to fully obtain PLOF/history.     Cognition  Cognition Overall Cognitive Status: No family/caregiver present to determine baseline cognitive functioning Cognition - Other Comments: Mostly WFL during eval.      Extremity/Trunk Assessment Right Lower Extremity Assessment RLE ROM/Strength/Tone: Deficits RLE ROM/Strength/Tone Deficits: pt with generalized weakness, grossly 3+/5 per functional observation.  RLE Sensation: WFL - Light Touch Left Lower Extremity Assessment LLE ROM/Strength/Tone: Deficits LLE ROM/Strength/Tone Deficits: pt with generalized weakness, grossly 3+/5 per functional observation.  Pt does mention some pain and weakness in L leg from arthritis.  LLE Sensation: WFL - Light Touch Trunk Assessment Trunk Assessment: Kyphotic;Other exceptions Trunk Exceptions: very forward flexed posture.    Balance    End of Session PT - End of Session Equipment Utilized During Treatment: Gait belt Activity Tolerance: Patient tolerated treatment well Patient left: in chair;with call bell/phone within reach Nurse Communication: Mobility status  GP     Vista Deck 06/16/2012, 4:41 PM

## 2012-06-16 NOTE — Progress Notes (Signed)
Subjective: [name] presented tot he ED with Fever, weight loss, tachypnea, tachycardia and increased confusion. Initial eval with positive U/A, lactic acid of 5.87 and elevated procalcitonin at 27.51. He is admitted with Urinary tract infection, possibly pyelonephritis with SIRS based on fever, tachycardia, tachypnea and chemical markers.  He is awake, complains of penile pain/foley pain and is disoriented. He is very emotional about his condition.   Objective: Lab: Lab Results  Component Value Date   WBC 10.1 06/15/2012   HGB 9.9* 06/15/2012   HCT 28.1* 06/15/2012   MCV 82.9 06/15/2012   PLT 136* 06/15/2012   BMET    Component Value Date/Time   NA 139 06/16/2012 0450   K 3.9 06/16/2012 0450   CL 109 06/16/2012 0450   CO2 24 06/16/2012 0450   GLUCOSE 76 06/16/2012 0450   BUN 13 06/16/2012 0450   CREATININE 1.13 06/16/2012 0450   CALCIUM 7.9* 06/16/2012 0450   GFRNONAA 56* 06/16/2012 0450   GFRAA 65* 06/16/2012 0450  lactic acid 5.87 to 1.7 with hydration; procalcitonin 27. U/A with positive LE, many bacteria. Culture pending   Imaging: CXR: IMPRESSION:  Hyperinflated lungs without acute infiltrate.  Inferior left acromial spur formation which may predispose the  patient to rotator cuff pathology.  Scheduled Meds: . enoxaparin (LOVENOX) injection  40 mg Subcutaneous Q24H  . latanoprost  1 drop Left Eye QHS  . levobunolol  1 drop Left Eye Daily  . sodium chloride  3 mL Intravenous Q12H   Continuous Infusions: . sodium chloride 100 mL/hr at 06/15/12 2348  . cefTRIAXone (ROCEPHIN)  IV     PRN Meds:.acetaminophen, acetaminophen, ondansetron (ZOFRAN) IV, ondansetron   Physical Exam: Filed Vitals:   06/16/12 0525  BP: 134/72  Pulse: 60  Temp:   Resp: 19  No intake or output data in the 24 hours ending 06/16/12 0752 Gen'l- Elderly thin AA man in some discomfort and emotionally stressed HEENT - blind OD, no oral lesions Neck - supple Cor - 2+ radial, RRR Pulm - normal  respirations w/o increased WOB, no rales or wheezes Abd- BS+, soft, generalized tenderness, no guarding or rebound Genitalia - normal penis w/o erythema or swelling, foley in place, no blood at the meatus. Neuro - awake, confused, emotional. Normal facial symmetry.      Assessment/Plan: 1. ID - patient with positive urinalysis, elevated procalcitonin and lactic acid.  Plan Continue rocephin  Urine culture (Pending)  2. SIRS - seems metabolically stable and able to managed on the floor. Plan Hydrate  Treat underlying infection  3. Anemia - acute loss vs chronic disease Plan Anemia panel  4. Dementia - currently more confused than usual but not agitated. He has not been on psychotropic medications Plan Supportive care and orientation.  Ativan for severe agitation/anxiety  5. FTT -  Patient is thin, reports poor intake - looks malnourished. He has been living at home, daughter nearby (in the home?) Plan RD consult  Will evaluated home situation in regard to adequate support.    Illene Regulus [facility] (o) [phone]; (c) [phone] Call-grp - [facility]  Tele: [phone]  06/16/2012, 7:49 AM

## 2012-06-16 NOTE — ED Provider Notes (Signed)
Medical screening examination/treatment/procedure(s) were conducted as a shared visit with non-physician practitioner(s) and myself.  I personally evaluated the patient during the encounter.  89yM with fever and apparently SOB per family, although pt with no complaints to me. Significantly elevated lactic acid and empiric abx for HAP given recent hospitalization of SOB. Subsequently, source more likely UTI. Admission.   Raeford Razor, MD 06/16/12 [phone]

## 2012-06-16 NOTE — Progress Notes (Signed)
CARE MANAGEMENT NOTE 06/16/2012  Patient:  [name], [name]   Account Number:  [id number]  Date Initiated:  06/16/2012  Documentation initiated by:  Karas Pickerill  Subjective/Objective Assessment:   79 year old admitted with fever, tachycardia elevated bp, poss sirs     Action/Plan:   lives at home with support form children   Anticipated DC Date:  06/19/2012   Anticipated DC Plan:  ASSISTED LIVING / REST HOME  In-house referral  NA      DC Planning Services  NA      PAC Choice  NA   Choice offered to / List presented to:  NA   DME arranged  NA      DME agency  NA     HH arranged  NA      HH agency  NA   Status of service:  In process, will continue to follow Medicare Important Message given?  NA - LOS <3 / Initial given by admissions (If response is "NO", the following Medicare IM given date fields will be blank) Date Medicare IM given:   Date Additional Medicare IM given:    Discharge Disposition:    Per UR Regulation:  Reviewed for med. necessity/level of care/duration of stay  If discussed at Long Length of Stay Meetings, dates discussed:    Comments:  [id number]/Britanni Yarde Earlene Plater, RN, BSN, CCM:  CHART REVIEWED AND UPDATED.  Next chart review due on [id number]. NO DISCHARGE NEEDS PRESENT AT THIS TIME. CASE MANAGEMENT [phone]

## 2012-06-17 DIAGNOSIS — H5440 Blindness, one eye, unspecified eye: Secondary | ICD-10-CM

## 2012-06-17 LAB — CBC
HCT: 28 % — ABNORMAL LOW (ref 39.0–52.0)
Hemoglobin: 9.8 g/dL — ABNORMAL LOW (ref 13.0–17.0)
MCH: 28.8 pg (ref 26.0–34.0)
MCHC: 35 g/dL (ref 30.0–36.0)
RBC: 3.4 MIL/uL — ABNORMAL LOW (ref 4.22–5.81)

## 2012-06-17 LAB — BASIC METABOLIC PANEL
BUN: 10 mg/dL (ref 6–23)
CO2: 24 mEq/L (ref 19–32)
Chloride: 107 mEq/L (ref 96–112)
GFR calc non Af Amer: 59 mL/min — ABNORMAL LOW (ref >90)
Glucose, Bld: 104 mg/dL — ABNORMAL HIGH (ref 70–99)
Potassium: 3.8 mEq/L (ref 3.5–5.1)
Sodium: 138 mEq/L (ref 135–145)

## 2012-06-17 MED ORDER — CIPROFLOXACIN IN D5W 400 MG/200ML IV SOLN
400.0000 mg | Freq: Two times a day (BID) | INTRAVENOUS | Status: DC
  Administered 2012-06-17 – 2012-06-18 (×2): 400 mg via INTRAVENOUS
  Filled 2012-06-17 (×3): qty 200

## 2012-06-17 MED ORDER — LIDOCAINE HCL 2 % EX GEL
Freq: Once | CUTANEOUS | Status: AC
  Administered 2012-06-17: 5 via URETHRAL
  Filled 2012-06-17: qty 5

## 2012-06-17 NOTE — Progress Notes (Signed)
Patient voided 240 cc during the day. When bladder scanned after not being able to void spontaneously, it showed 549 cc. Received orders for I&O cath. Attempted but patient in pain and hematuria noted. Received new orders for lidocaine jelly and to place foley back.

## 2012-06-17 NOTE — Progress Notes (Signed)
Subjective: [name] is much more alert this AM and looks better. He still c/o penile pain but it is not worse. He has an appetite.  Objective: Lab: Lab Results  Component Value Date   WBC 11.1* 06/17/2012   HGB 9.8* 06/17/2012   HCT 28.0* 06/17/2012   MCV 82.4 06/17/2012   PLT 111* 06/17/2012   BMET    Component Value Date/Time   NA 138 06/17/2012 0348   K 3.8 06/17/2012 0348   CL 107 06/17/2012 0348   CO2 24 06/17/2012 0348   GLUCOSE 104* 06/17/2012 0348   BUN 10 06/17/2012 0348   CREATININE 1.08 06/17/2012 0348   CALCIUM 8.5 06/17/2012 0348   GFRNONAA 59* 06/17/2012 0348   GFRAA 69* 06/17/2012 0348   Microp - Urine Cx - >100,000 GNR  Imaging: No new imaging Scheduled Meds: . cefTRIAXone (ROCEPHIN)  IV  1 g Intravenous Daily  . enoxaparin (LOVENOX) injection  40 mg Subcutaneous Q24H  . feeding supplement  237 mL Oral BID BM  . latanoprost  1 drop Left Eye QHS  . levobunolol  1 drop Left Eye Daily  . multivitamin with minerals  1 tablet Oral Daily  . sodium chloride  3 mL Intravenous Q12H   Continuous Infusions: . sodium chloride 100 mL/hr at 06/17/12 0644   PRN Meds:.acetaminophen, acetaminophen, LORazepam, ondansetron (ZOFRAN) IV, ondansetron   Physical Exam: Filed Vitals:   06/17/12 0540  BP: 161/65  Pulse: 56  Temp:   Resp: 18    Intake/Output Summary (Last 24 hours) at 06/17/12 0831 Last data filed at 06/17/12 0500  Gross per 24 hour  Intake   3810 ml  Output   1675 ml  Net   2135 ml   Gen'l - Thin elderly AA man in no acute distress. HEENT- blind OD Cor- 2+ radial RRR Pulm - normal respirations, no cough, no rales or wheezes Abd- soft, BS+, no guarding Genitalia - foley in place, no discharge or blood at the meatus. The distal penile shaft, more posteriorly, remains swollen w/o erythema, not dusky or discolored, no apparent injury Neuro - A&O x 3, speech at baseline.Follows directions      Assessment/Plan: 1. ID - day # 2 rocephin. WBC trending  down, not febrile Plan Continue Rocephin  Transfer to med-surg bed  2. SIRS - stable blood pressure, not tachypneic or tachycardic. Normal renal function a lytes.  3. Anemia -Iron is low, Iron % sat is normal, folate normal, B12 is normal  4. Dementia - at his baseline  5. FTT - RD has recommended supplements. PT recommends 24/7 attendance at home vs SNF Plan - will need to talk with daughter about options and plans  6. GU - swollen penis - no sign of infection or vasuclar problems. Plan D/c foley   Illene Regulus [place] [facility] (o) [phone]; (c) [phone] Call-grp - [facility]  Tele: [phone]  06/17/2012, 8:30 AM

## 2012-06-17 NOTE — Progress Notes (Signed)
CRITICAL VALUE ALERT  Critical value received:  Anerobic gram neg rods   Date of notification:  06/17/12  Time of notification:  2100  Critical value read back:yes  Nurse who received alert:  Selinda Flavin  MD notified (1st page):  Dr. Kevan Ny  Time of first page:  2105  MD notified (2nd page):  Time of second page:  Responding MD:  Dr. gates  Time MD responded:  2110, cipro ordered

## 2012-06-18 DIAGNOSIS — N401 Enlarged prostate with lower urinary tract symptoms: Secondary | ICD-10-CM

## 2012-06-18 DIAGNOSIS — R339 Retention of urine, unspecified: Secondary | ICD-10-CM

## 2012-06-18 LAB — BASIC METABOLIC PANEL
CO2: 24 mEq/L (ref 19–32)
Calcium: 8.1 mg/dL — ABNORMAL LOW (ref 8.4–10.5)
Creatinine, Ser: 1.1 mg/dL (ref 0.50–1.35)
GFR calc non Af Amer: 58 mL/min — ABNORMAL LOW (ref >90)
Glucose, Bld: 98 mg/dL (ref 70–99)

## 2012-06-18 LAB — CBC WITH DIFFERENTIAL/PLATELET
Eosinophils Absolute: 0 10*3/uL (ref 0.0–0.7)
Eosinophils Relative: 0 % (ref 0–5)
HCT: 31.2 % — ABNORMAL LOW (ref 39.0–52.0)
Lymphocytes Relative: 5 % — ABNORMAL LOW (ref 12–46)
Lymphs Abs: 1 10*3/uL (ref 0.7–4.0)
MCH: 28.8 pg (ref 26.0–34.0)
MCV: 82.3 fL (ref 78.0–100.0)
Monocytes Absolute: 1.5 10*3/uL — ABNORMAL HIGH (ref 0.1–1.0)
RBC: 3.79 MIL/uL — ABNORMAL LOW (ref 4.22–5.81)
RDW: 15.2 % (ref 11.5–15.5)
WBC: 17.9 10*3/uL — ABNORMAL HIGH (ref 4.0–10.5)

## 2012-06-18 MED ORDER — TAMSULOSIN HCL 0.4 MG PO CAPS
0.4000 mg | ORAL_CAPSULE | Freq: Every day | ORAL | Status: DC
  Administered 2012-06-18 – 2012-06-25 (×8): 0.4 mg via ORAL
  Filled 2012-06-18 (×9): qty 1

## 2012-06-18 NOTE — Progress Notes (Signed)
CRITICAL VALUE ALERT  Critical value received:  Aerobic gram positive rods  Date of notification:  06/18/2012  Time of notification:  0321  Critical value read back:yes  Nurse who received alert:  Selinda Flavin  MD notified (1st page):  Dr. Kevan Ny  Time of first page:  0321  MD notified (2nd page): Dr. Kevan Ny  Time of second page: [phone]   Responding MD:  Dr. Kevan Ny  Time MD responded:  0401, no new orders received

## 2012-06-18 NOTE — Progress Notes (Signed)
Subjective: Awake eating lunch. He states he feels better. Events of last PM note: urinary retention leading to foley replacement. He reports much decreased penile pain.  Objective: Lab: Lab Results  Component Value Date   WBC 17.9* 06/18/2012   HGB 10.9* 06/18/2012   HCT 31.2* 06/18/2012   MCV 82.3 06/18/2012   PLT 133* 06/18/2012   BMET    Component Value Date/Time   NA 138 06/18/2012 0341   K 3.7 06/18/2012 0341   CL 108 06/18/2012 0341   CO2 24 06/18/2012 0341   GLUCOSE 98 06/18/2012 0341   BUN 9 06/18/2012 0341   CREATININE 1.10 06/18/2012 0341   CALCIUM 8.1* 06/18/2012 0341   GFRNONAA 58* 06/18/2012 0341   GFRAA 67* 06/18/2012 0341   Micro - 1:2 blood cultures with GNR  Imaging:  Scheduled Meds: . cefTRIAXone (ROCEPHIN)  IV  1 g Intravenous Daily  . ciprofloxacin  400 mg Intravenous Q12H  . enoxaparin (LOVENOX) injection  40 mg Subcutaneous Q24H  . feeding supplement  237 mL Oral BID BM  . latanoprost  1 drop Left Eye QHS  . levobunolol  1 drop Left Eye Daily  . multivitamin with minerals  1 tablet Oral Daily  . sodium chloride  3 mL Intravenous Q12H   Continuous Infusions: . sodium chloride 100 mL/hr at 06/18/12 0406   PRN Meds:.acetaminophen, acetaminophen, LORazepam, ondansetron (ZOFRAN) IV, ondansetron   Physical Exam: Filed Vitals:   06/18/12 0629  BP: 138/56  Pulse: 76  Temp: 98.2 F (36.8 C)  Resp: 16  Gen'l - elderly AA man in no distress HEENT- OD blind, Cor- RRR Pulm - normal respirations Abd - BS+, soft, no guarding or rebound GU - decrease penile swelling. Foley in place.       Assessment/Plan: 1. ID - day #3 rocecphin  WBC has gone back up to 17.9. No fever.  Plan - continue rocephin  F/u CBC 2. SIRS - resolved 3-4) stable 5) FTT - will check with dtr 6) GU - decrease swelling of penis. Poor flow Plan - add flomax   Illene Regulus [place] [facility] (o) [phone]; (c) [phone] Call-grp - [facility] IM  Tele: [phone]  06/18/2012, 12:26  PM

## 2012-06-18 NOTE — Progress Notes (Signed)
Physical Therapy Treatment Patient Details Name: [name] MRN: [id number] DOB: 17-Jan-1923 Today's Date: 06/18/2012 Time: 7829-5621 PT Time Calculation (min): 16 min  PT Assessment / Plan / Recommendation Comments on Treatment Session  Pt. expresses that he likes to walk. He does state that he is having difficulty at meals and cannot see his meal. Pt. is very participatory. slowly improving.    Follow Up Recommendations  SNF;Supervision/Assistance - 24 hour     Does the patient have the potential to tolerate intense rehabilitation     Barriers to Discharge        Equipment Recommendations  Rolling walker with 5" wheels    Recommendations for Other Services    Frequency Min 3X/week   Plan Discharge plan remains appropriate    Precautions / Restrictions Precautions Precautions: Fall Precaution Comments: visually impaired.   Pertinent Vitals/Pain States his back hurts a little.    Mobility  Bed Mobility Supine to Sit: 4: Min assist;With rails;HOB elevated Details for Bed Mobility Assistance: Some assist for trunk when getting into sitting position.  Pt able to manage LEs on his own with increased time and cues for hand placement to get closer to EOB.  Transfers Sit to Stand: 4: Min assist;3: Mod assist;From elevated surface;With upper extremity assist;From bed Stand to Sit: 4: Min assist;With upper extremity assist;With armrests;To chair/3-in-1 Details for Transfer Assistance: Assist to rise and steady with some posterior leaning initially and cues for hand placement and safety   Ambulation/Gait Ambulation/Gait Assistance: 1: +2 Total assist Ambulation/Gait: Patient Percentage: 60% Ambulation Distance (Feet): 175 Feet Assistive device: Rolling walker Ambulation/Gait Assistance Details: requires assistance to guide RW due to vision, also tends to steer to R. Gait Pattern: Step-through pattern;Trunk flexed;Decreased stride length Gait velocity: decreased    Exercises      PT Diagnosis:    PT Problem List:   PT Treatment Interventions:     PT Goals Acute Rehab PT Goals Pt will go Supine/Side to Sit: with supervision PT Goal: Supine/Side to Sit - Progress: Progressing toward goal Pt will go Sit to Stand: with supervision PT Goal: Sit to Stand - Progress: Progressing toward goal Pt will go Stand to Sit: with supervision PT Goal: Stand to Sit - Progress: Progressing toward goal Pt will Ambulate: >150 feet;with supervision;with least restrictive assistive device PT Goal: Ambulate - Progress: Progressing toward goal  Visit Information  Last PT Received On: 06/18/12 Assistance Needed: +2 (safety)    Subjective Data  Subjective: If it will help me.    Cognition  Cognition Overall Cognitive Status: No family/caregiver present to determine baseline cognitive functioning Arousal/Alertness: Awake/alert Behavior During Session: [facility] for tasks performed    Balance     End of Session PT - End of Session Activity Tolerance: Patient tolerated treatment well Patient left: in chair;with call bell/phone within reach Nurse Communication: Mobility status   GP     Rada Hay 06/18/2012, 4:03 PM Blanchard Kelch PT [phone]

## 2012-06-19 LAB — CBC
HCT: 24.6 % — ABNORMAL LOW (ref 39.0–52.0)
MCH: 29.8 pg (ref 26.0–34.0)
MCH: 29.2 pg (ref 26.0–34.0)
MCHC: 36.2 g/dL — ABNORMAL HIGH (ref 30.0–36.0)
MCHC: 35.2 g/dL (ref 30.0–36.0)
MCV: 82.3 fL (ref 78.0–100.0)
Platelets: 133 10*3/uL — ABNORMAL LOW (ref 150–400)
RDW: 15.2 % (ref 11.5–15.5)
RDW: 15 % (ref 11.5–15.5)

## 2012-06-19 NOTE — Progress Notes (Signed)
Subjective: [name] is awake and alert. He has no c/o. RN note reviewed re: bladder emptying.  Objective: Lab: Lab Results  Component Value Date   WBC 10.4 06/19/2012   HGB 9.0* 06/19/2012   HCT 24.6* 06/19/2012   MCV 82.3 06/19/2012   PLT 120* 06/19/2012   BMET    Component Value Date/Time   NA 138 06/18/2012 0341   K 3.7 06/18/2012 0341   CL 108 06/18/2012 0341   CO2 24 06/18/2012 0341   GLUCOSE 98 06/18/2012 0341   BUN 9 06/18/2012 0341   CREATININE 1.10 06/18/2012 0341   CALCIUM 8.1* 06/18/2012 0341   GFRNONAA 58* 06/18/2012 0341   GFRAA 67* 06/18/2012 0341     Imaging:  Scheduled Meds: . cefTRIAXone (ROCEPHIN)  IV  1 g Intravenous Daily  . enoxaparin (LOVENOX) injection  40 mg Subcutaneous Q24H  . feeding supplement  237 mL Oral BID BM  . latanoprost  1 drop Left Eye QHS  . levobunolol  1 drop Left Eye Daily  . multivitamin with minerals  1 tablet Oral Daily  . sodium chloride  3 mL Intravenous Q12H  . tamsulosin  0.4 mg Oral Daily   Continuous Infusions: . sodium chloride 100 mL/hr at 06/19/12 0429   PRN Meds:.acetaminophen, acetaminophen, LORazepam, ondansetron (ZOFRAN) IV, ondansetron   Physical Exam: Filed Vitals:   06/19/12 0451  BP: 143/61  Pulse: 71  Temp: 99.1 F (37.3 C)  Resp: 18  elderly AA man in no distress HEENT - OD blind. Cor- quiet precordium,  Pulm - normal respirations Abd- soft, no guarding or rebound Neuro - A&O      Assessment/Plan: 1. ID - Day #4 rocephin. WBC down. Plan Continue Rocephin.  2. SIRS- resolved  3. Anemia - slight drop in Hgb - possibly dilutional  4. Dementia - no change  5. FTT- taking a better diet. Daughter can manage at home. Will need home PT  6. GU - decrease swelling of the penis. Plan - voiding trial   Illene Regulus [employer] [facility] (o) [phone]; (c) [phone] Call-grp - [facility]ats[facility] IM  Tele: [phone]  06/19/2012, 10:45 AM

## 2012-06-19 NOTE — Progress Notes (Signed)
This AM pts foley had 100cc present in bag so bladder scan was performed which showed pt had 200cc in bladder.  Pt denied urge to void or any discomfort.  After bladder scan I manipulated foley tubing and pressed on pts bladder resulting in 200cc more of urine in the foley bag.  Will continue to monitor.  Fatima Sanger A

## 2012-06-19 NOTE — Progress Notes (Signed)
Physical Therapy Treatment Patient Details Name: [name] MRN: [id number] DOB: 08-01-1922 Today's Date: 06/19/2012 Time: 1324-4010 PT Time Calculation (min): 23 min  PT Assessment / Plan / Recommendation Comments on Treatment Session  Pt continues to slowly progress with mobility and is still very participatory with therapy.  Pt does c/o fatigue after returning from walk.     Follow Up Recommendations  SNF;Supervision/Assistance - 24 hour     Does the patient have the potential to tolerate intense rehabilitation     Barriers to Discharge        Equipment Recommendations  Rolling walker with 5" wheels    Recommendations for Other Services    Frequency Min 3X/week   Plan Discharge plan remains appropriate    Precautions / Restrictions Precautions Precautions: Fall Precaution Comments: visually impaired. (blind in right eye) Restrictions Weight Bearing Restrictions: No   Pertinent Vitals/Pain No stated pain    Mobility  Bed Mobility Bed Mobility: Supine to Sit Supine to Sit: 3: Mod assist Details for Bed Mobility Assistance: Assist to initiate movement with BLEs, then pt able to bring them off of bed.  He also requires assist for trunk to get into sitting position.  Attempted to give cues for hand placement on bed, however pt insisted on reaching for therapist.  Transfers Transfers: Sit to Stand;Stand to Sit Sit to Stand: 4: Min assist;From elevated surface;With upper extremity assist;From bed Stand to Sit: 4: Min assist;With upper extremity assist;With armrests;To chair/3-in-1 Details for Transfer Assistance: Performed x 2 in order to use 3in1 by bedside (unable to ambulate in restroom due to urgency).  Cues for hand placement and safety.  Ambulation/Gait Ambulation/Gait Assistance: 1: +2 Total assist Ambulation/Gait: Patient Percentage: 60% Ambulation Distance (Feet): 180 Feet Assistive device: Rolling walker Ambulation/Gait Assistance Details: Continues to  require assist to steady thoughout and to negotiate RW as pt has diminished eye sight.  Cues for maintaining position inside of RW and upright posture.  Gait Pattern: Step-through pattern;Trunk flexed;Decreased stride length Gait velocity: decreased    Exercises General Exercises - Lower Extremity Ankle Circles/Pumps: AROM;Both;20 reps Straight Leg Raises: Strengthening;Both;10 reps   PT Diagnosis:    PT Problem List:   PT Treatment Interventions:     PT Goals Acute Rehab PT Goals PT Goal Formulation: With patient Time For Goal Achievement: 06/30/12 Potential to Achieve Goals: Good Pt will go Supine/Side to Sit: with supervision PT Goal: Supine/Side to Sit - Progress: Progressing toward goal Pt will go Sit to Stand: with supervision PT Goal: Sit to Stand - Progress: Progressing toward goal Pt will go Stand to Sit: with supervision PT Goal: Stand to Sit - Progress: Progressing toward goal Pt will Ambulate: >150 feet;with supervision;with least restrictive assistive device PT Goal: Ambulate - Progress: Progressing toward goal  Visit Information  Last PT Received On: 06/19/12 Assistance Needed: +2    Subjective Data  Subjective: How am I going to walk with this tube (in reference to foley cath) Patient Stated Goal: n/a   Cognition  Cognition Overall Cognitive Status: No family/caregiver present to determine baseline cognitive functioning Arousal/Alertness: Awake/alert Orientation Level: Appears intact for tasks assessed Behavior During Session: [facility] for tasks performed Cognition - Other Comments: Mostly WFL during eval.      Balance     End of Session PT - End of Session Equipment Utilized During Treatment: Gait belt Activity Tolerance: Patient limited by fatigue Patient left: in chair;with call bell/phone within reach Nurse Communication: Mobility status   GP  Vista Deck 06/19/2012, 2:35 PM

## 2012-06-19 NOTE — Clinical Social Work Psychosocial (Signed)
     Clinical Social Work Department BRIEF PSYCHOSOCIAL ASSESSMENT 06/19/2012  Patient:  [name], [name]     Account Number:  [id number]     Admit date:  06/15/2012  Clinical Social Worker:  Orpah Greek  Date/Time:  06/19/2012 02:04 PM  Referred by:  Physician  Date Referred:  06/19/2012 Referred for  SNF Placement   Other Referral:   Interview type:  Patient Other interview type:   and daughter, [name]    PSYCHOSOCIAL DATA Living Status:  WITH ADULT CHILDREN Admitted from facility:   Level of care:   Primary support name:  [name] (daughter) h#: [phone] c#: [phone] Primary support relationship to patient:  CHILD, ADULT Degree of support available:   good    CURRENT CONCERNS Current Concerns  Post-Acute Placement   Other Concerns:    SOCIAL WORK ASSESSMENT / PLAN CSW spoke with patient at bedside & daughter, [name] via phone re: discharge planning. Patient was admitted from home with his daughter, though PT recommended SNF - daughter is agreeable with plan and requesting [facility].   Assessment/plan status:  Information/Referral to [facility] Other assessment/ plan:   Information/referral to community resources:   CSW completed FL2 and faxed information out to [facility] - confirmed with [facility] that they would be able to offer a bed, pending available.    PATIENTS/FAMILYS RESPONSE TO PLAN OF CARE: Daughter was agreeable with plan for SNF - [facility], though after thinking about it - called CSW back and said she would like to try him at home first with home health. CSW will follow-up with patient & daughter Monday if he's still here.

## 2012-06-19 NOTE — Clinical Social Work Placement (Addendum)
    Daughter was agreeable with plan for SNF - [facility], though after thinking about it - called CSW back and said she would like to try him at home first with home health. CSW will follow-up with patient & daughter Monday if he's still here.   Clinical Social Work Department CLINICAL SOCIAL WORK PLACEMENT NOTE 06/19/2012  Patient:  [name], [name]  Account Number:  [id number] Admit date:  06/15/2012  Clinical Social Worker:  Orpah Greek  Date/time:  06/19/2012 02:34 PM  Clinical Social Work is seeking post-discharge placement for this patient at the following level of care:   SKILLED NURSING   (*CSW will update this form in Epic as items are completed)   06/19/2012  Patient/family provided with [facility] Department of Clinical Social Work's list of facilities offering this level of care within the geographic area requested by the patient (or if unable, by the patient's family).  06/19/2012  Patient/family informed of their freedom to choose among providers that offer the needed level of care, that participate in Medicare, Medicaid or managed care program needed by the patient, have an available bed and are willing to accept the patient.  06/19/2012  Patient/family informed of [facility]' ownership interest in [facility], as well as of the fact that they are under no obligation to receive care at this facility.  PASARR submitted to EDS on 06/19/2012 PASARR number received from EDS on 06/19/2012  FL2 transmitted to all facilities in geographic area requested by pt/family on  06/19/2012 FL2 transmitted to all facilities within larger geographic area on   Patient informed that his/her managed care company has contracts with or will negotiate with  certain facilities, including the following:     Patient/family informed of bed offers received:  06/19/2012 Patient chooses bed at  Physician recommends and patient chooses bed at    Patient to be transferred to   on  Home with Home Health Services on 06/22/2012 Patient to be transferred to facility by   The following physician request were entered in Epic:   Additional Comments:  Pt daughter plans to care for pt at home with home health services involved. CSW signed off.    Unice Bailey, LCSW [facility] Clinical Social Worker cell #: [phone]   Jacklynn Lewis, MSW, LCSWA  Clinical Social Work [phone]

## 2012-06-20 DIAGNOSIS — R339 Retention of urine, unspecified: Secondary | ICD-10-CM

## 2012-06-20 LAB — BASIC METABOLIC PANEL
BUN: 7 mg/dL (ref 6–23)
CO2: 25 mEq/L (ref 19–32)
Chloride: 107 mEq/L (ref 96–112)
GFR calc non Af Amer: 71 mL/min — ABNORMAL LOW (ref >90)
Glucose, Bld: 91 mg/dL (ref 70–99)
Potassium: 4.1 mEq/L (ref 3.5–5.1)
Sodium: 137 mEq/L (ref 135–145)

## 2012-06-20 LAB — CULTURE, BLOOD (ROUTINE X 2)

## 2012-06-20 LAB — URINE CULTURE: Colony Count: >=100000

## 2012-06-20 MED ORDER — LIDOCAINE HCL 2 % EX GEL
Freq: Once | CUTANEOUS | Status: AC
  Administered 2012-06-20: 11:00:00 via URETHRAL
  Filled 2012-06-20: qty 5

## 2012-06-20 MED ORDER — AMLODIPINE BESYLATE 5 MG PO TABS
5.0000 mg | ORAL_TABLET | Freq: Every day | ORAL | Status: DC
  Administered 2012-06-21 – 2012-06-25 (×6): 5 mg via ORAL
  Filled 2012-06-20 (×7): qty 1

## 2012-06-20 MED ORDER — FINASTERIDE 5 MG PO TABS
5.0000 mg | ORAL_TABLET | Freq: Every day | ORAL | Status: DC
Start: 2012-06-20 — End: 2012-06-25
  Administered 2012-06-20 – 2012-06-25 (×6): 5 mg via ORAL
  Filled 2012-06-20 (×6): qty 1

## 2012-06-20 MED ORDER — LIDOCAINE HCL 2 % EX GEL: Freq: Once | CUTANEOUS | Status: DC

## 2012-06-20 NOTE — Progress Notes (Signed)
Pt's BP was 181/69 when reassessed. Reported this off to Dr. Earl Gala and received order for 5mg  amlodipine daily for pt. Continuing to monitor pt.

## 2012-06-20 NOTE — Progress Notes (Signed)
OT Cancellation Note  Patient Details Name: [name] MRN: [id number] DOB: [birth year]   Cancelled Treatment:    Reason Eval/Treat Not Completed:  Pt with dependency in all ADL prior to admission.  Discharge plan is for SNF placement, will defer OT to SNF's discretion.  Signing off.  Evern Bio 06/20/2012, 12:47 PM [phone]

## 2012-06-20 NOTE — Plan of Care (Signed)
Problem: Phase I Progression Outcomes Goal: Voiding-avoid urinary catheter unless indicated Outcome: Not Progressing Pt has foley catheter r/t poor bladder control

## 2012-06-20 NOTE — Progress Notes (Signed)
Subjective: Mr. Cassada has felt chilled. UOP has been irregular and low. He wasn't c/o lower abdominal pain until examined.  Objective: Lab: Lab Results  Component Value Date   WBC 10.1 06/19/2012   HGB 9.9* 06/19/2012   HCT 28.1* 06/19/2012   MCV 82.9 06/19/2012   PLT 133* 06/19/2012   BMET    Component Value Date/Time   NA 137 06/20/2012 0434   K 4.1 06/20/2012 0434   CL 107 06/20/2012 0434   CO2 25 06/20/2012 0434   GLUCOSE 91 06/20/2012 0434   BUN 7 06/20/2012 0434   CREATININE 0.99 06/20/2012 0434   CALCIUM 8.1* 06/20/2012 0434   GFRNONAA 71* 06/20/2012 0434   GFRAA 82* 06/20/2012 0434     Imaging: no new imaging   Scheduled Meds: . cefTRIAXone (ROCEPHIN)  IV  1 g Intravenous Daily  . enoxaparin (LOVENOX) injection  40 mg Subcutaneous Q24H  . feeding supplement  237 mL Oral BID BM  . latanoprost  1 drop Left Eye QHS  . levobunolol  1 drop Left Eye Daily  . multivitamin with minerals  1 tablet Oral Daily  . sodium chloride  3 mL Intravenous Q12H  . tamsulosin  0.4 mg Oral Daily   Continuous Infusions: . sodium chloride 100 mL/hr at 06/19/12 0429   PRN Meds:.acetaminophen, acetaminophen, LORazepam, ondansetron (ZOFRAN) IV, ondansetron   Physical Exam: Filed Vitals:   06/20/12 0435  BP: 166/66  Pulse: 75  Temp: 98.6 F (37 C)  Resp: 16    Intake/Output Summary (Last 24 hours) at 06/20/12 1032 Last data filed at 06/20/12 0156  Gross per 24 hour  Intake    303 ml  Output    751 ml  Net   -448 ml   Gen'l- elderly AA man in no distress until examined at which point he lost bladder control HEENT- no change Cor 2+ radial, RRR Pulm - normal respirations Abd- BS+, dullness to percussion from just below the umbilicus, and tender. Neuro - no change      Assessment/Plan: 1. ID Day #5/7 rocephin  3. Anemia - will recheck CBC in AM  4. Dementia - no change  5. FTT-  Taking a better diet. CSW notes reviewed: daughter prefers to try managing patient at  home  6. GU - failed voiding trial: has urinary retention on physical exam and over-flow incontinence Plan - replace foley  Continue Tamsulosin  GU consult if unable to pass foley, otherwise prior to d/c   [employer] IM (o) [phone]; (c) [phone] Call-grp - Patsi Sears IM  Tele: [phone]  06/20/2012, 10:25 AM

## 2012-06-20 NOTE — Care Management (Signed)
Cm has reviewed CSW note. Pt's discharge plan to include SNF. Pt and daughter have agreed to placement at [facility]. CM to sign off.   Roxy Manns Nabeel Gladson,RN,BSN [phone]

## 2012-06-21 LAB — CBC
HCT: 25.9 % — ABNORMAL LOW (ref 39.0–52.0)
Hemoglobin: 9.2 g/dL — ABNORMAL LOW (ref 13.0–17.0)
MCH: 29.3 pg (ref 26.0–34.0)
MCHC: 35.5 g/dL (ref 30.0–36.0)
MCV: 82.5 fL (ref 78.0–100.0)
RBC: 3.14 MIL/uL — ABNORMAL LOW (ref 4.22–5.81)

## 2012-06-21 NOTE — Progress Notes (Signed)
Subjective: Issues of elevated BP relayed to me by Dr.Osborne. No reports of difficulty with foley placement.   [name] is awake but not oriented to place or context. He feels the need to "push" to empty his bladder which causes him discomfort.  Objective: Lab: Lab Results  Component Value Date   WBC 6.0 06/21/2012   HGB 9.2* 06/21/2012   HCT 25.9* 06/21/2012   MCV 82.5 06/21/2012   PLT 138* 06/21/2012   BMET    Component Value Date/Time   NA 137 06/20/2012 0434   K 4.1 06/20/2012 0434   CL 107 06/20/2012 0434   CO2 25 06/20/2012 0434   GLUCOSE 91 06/20/2012 0434   BUN 7 06/20/2012 0434   CREATININE 0.99 06/20/2012 0434   CALCIUM 8.1* 06/20/2012 0434   GFRNONAA 71* 06/20/2012 0434   GFRAA 82* 06/20/2012 0434     Imaging:  Scheduled Meds: . amLODipine  5 mg Oral Daily  . cefTRIAXone (ROCEPHIN)  IV  1 g Intravenous Daily  . enoxaparin (LOVENOX) injection  40 mg Subcutaneous Q24H  . feeding supplement  237 mL Oral BID BM  . finasteride  5 mg Oral Daily  . latanoprost  1 drop Left Eye QHS  . levobunolol  1 drop Left Eye Daily  . multivitamin with minerals  1 tablet Oral Daily  . sodium chloride  3 mL Intravenous Q12H  . tamsulosin  0.4 mg Oral Daily   Continuous Infusions: . sodium chloride 100 mL/hr at 06/20/12 1500   PRN Meds:.acetaminophen, acetaminophen, LORazepam, ondansetron (ZOFRAN) IV, ondansetron   Physical Exam: Filed Vitals:   06/21/12 0546  BP: 145/68  Pulse: 37  Temp: 98.7 F (37.1 C)  Resp: 16    Intake/Output Summary (Last 24 hours) at 06/21/12 1008 Last data filed at 06/21/12 0550  Gross per 24 hour  Intake   1500 ml  Output   2200 ml  Net   -700 ml   Elderly AA man in no acute distress but a little confused Cor- RRR Pulm - normal respirations Abd - BS+, tender to palpation lower quadrants GU - foley catheter in place. No penile swelling. Foley draining clear yellow urine Neuro - blind OD, a little confused with a non-focal  exam.       Assessment/Plan: 1. ID Day #6/7 Rocephin. No fever. WBC down to normal range.  2. SIRS - resovled  3. Anemia stable  4. Dementia - no change  5. FTT - taking a better diet. Question of disposition being home or SNF  6. GU- good drainage with Foley. Have started finasteride and tamsulosin.  Plan  will d/c with foley and arrange for out patient f/u with urology.   7. HTN- several readings yesterday with SBP >180. Has done well with low dose amlodipine Plan Will continue amlodipine  Dispo - spoke with his daughter - she is willing to take him home. Plan for d/c tomorrow. HH F-F form done. Request for hospital bed.      Illene Regulus [place] IM [phone]) [phone]; (c) [phone] Call-grp - Patsi Sears IM  Tele: [phone]  06/21/2012, 10:07 AM

## 2012-06-21 NOTE — Care Management Note (Addendum)
    Page 1 of 2   06/21/2012     4:26:22 PM   CARE MANAGEMENT NOTE 06/21/2012  Patient:  [name], [name]   Account Number:  [id number]  Date Initiated:  06/16/2012  Documentation initiated by:  DAVIS,RHONDA  Subjective/Objective Assessment:   79 year old admitted with fever, tachycardia elevated bp, poss sirs     Action/Plan:   lives at home with support form children   Anticipated DC Date:  06/23/2012   Anticipated DC Plan:  SKILLED NURSING FACILITY  In-house referral  NA      DC Planning Services  CM consult      PAC Choice  NA   Choice offered to / List presented to:  NA           Status of service:  In process, will continue to follow Medicare Important Message given?  NA - LOS <3 / Initial given by admissions (If response is "NO", the following Medicare IM given date fields will be blank) Date Medicare IM given:   Date Additional Medicare IM given:    Discharge Disposition:    Per UR Regulation:  Reviewed for med. necessity/level of care/duration of stay  If discussed at Long Length of Stay Meetings, dates discussed:    Comments:  06/21/12 Brylon Brenning RN,BSN NCM [phone] PER ED CM NOTE HAS ALREADY ADDRESSED ?AFFORDING MEDS-SINCE PATIENT HAS [employer] INSURANCE W/PRESCRIPTION COVERAGE,NOT ELIGIBLE FOR MED ASST PROGRAM(MATCH).PT/OT-SNF.LEFT MESSAGE FOR DTR DONNA C#[phone] TO DISCUSS PT/OT-RECOMMENDATIONS,& OTHER OPTIONS-HH/PRIVATE DUTY CARE.IT'S ALREADY NOTED PER ED CM HAS USED [facility] IN PAST.NSG MADE AWARE IF DTR CALLED FLOOR TO CALL ME & I WILL CALL HER BACK.HAVE LEFT HHC AGENCY LIST, & PRIVATE DUTY CARE AGENCY LIST IN RM AS A RESOURCE.MD HAS ALREADY ORDERED HHRN/PT.NOTED ?HOSPITAL BED MAY BE NEEDED IF D/C PLAN IS FOR HOME.  [id number] Earlene Plater, RN, BSN, CCM:  CHART REVIEWED AND UPDATED.  Next chart review due on [id number]. NO DISCHARGE NEEDS PRESENT AT THIS TIME. CASE MANAGEMENT [phone]

## 2012-06-22 ENCOUNTER — Inpatient Hospital Stay (HOSPITAL_COMMUNITY): Payer: Medicare Other

## 2012-06-22 ENCOUNTER — Other Ambulatory Visit: Payer: Self-pay | Admitting: Internal Medicine

## 2012-06-22 DIAGNOSIS — H409 Unspecified glaucoma: Secondary | ICD-10-CM

## 2012-06-22 DIAGNOSIS — I469 Cardiac arrest, cause unspecified: Secondary | ICD-10-CM

## 2012-06-22 DIAGNOSIS — J189 Pneumonia, unspecified organism: Secondary | ICD-10-CM

## 2012-06-22 DIAGNOSIS — R339 Retention of urine, unspecified: Secondary | ICD-10-CM

## 2012-06-22 LAB — CBC
HCT: 30.9 % — ABNORMAL LOW (ref 39.0–52.0)
Hemoglobin: 11 g/dL — ABNORMAL LOW (ref 13.0–17.0)
MCH: 29.6 pg (ref 26.0–34.0)
MCV: 83.1 fL (ref 78.0–100.0)
Platelets: 194 10*3/uL (ref 150–400)
RBC: 3.72 MIL/uL — ABNORMAL LOW (ref 4.22–5.81)
WBC: 4.8 10*3/uL (ref 4.0–10.5)

## 2012-06-22 LAB — COMPREHENSIVE METABOLIC PANEL WITH GFR
ALT: 7 U/L (ref 0–53)
AST: 19 U/L (ref 0–37)
Albumin: 1.8 g/dL — ABNORMAL LOW (ref 3.5–5.2)
Alkaline Phosphatase: 60 U/L (ref 39–117)
BUN: 6 mg/dL (ref 6–23)
CO2: 24 meq/L (ref 19–32)
Calcium: 8.2 mg/dL — ABNORMAL LOW (ref 8.4–10.5)
Chloride: 104 meq/L (ref 96–112)
Creatinine, Ser: 1.09 mg/dL (ref 0.50–1.35)
GFR calc Af Amer: 68 mL/min — ABNORMAL LOW
GFR calc non Af Amer: 59 mL/min — ABNORMAL LOW
Glucose, Bld: 124 mg/dL — ABNORMAL HIGH (ref 70–99)
Potassium: 4.7 meq/L (ref 3.5–5.1)
Sodium: 138 meq/L (ref 135–145)
Total Bilirubin: 0.2 mg/dL — ABNORMAL LOW (ref 0.3–1.2)
Total Protein: 5.1 g/dL — ABNORMAL LOW (ref 6.0–8.3)

## 2012-06-22 LAB — CULTURE, BLOOD (ROUTINE X 2)

## 2012-06-22 LAB — BLOOD GAS, ARTERIAL
Bicarbonate: 24.7 mEq/L — ABNORMAL HIGH (ref 20.0–24.0)
Drawn by: 244801
FIO2: 1 %
O2 Saturation: 97.2 %
Patient temperature: 98.6
pH, Arterial: 7.394 (ref 7.350–7.450)
pO2, Arterial: 92.7 mmHg (ref 80.0–100.0)

## 2012-06-22 LAB — CBC WITH DIFFERENTIAL/PLATELET
Basophils Absolute: 0 10*3/uL (ref 0.0–0.1)
Basophils Relative: 0 % (ref 0–1)
Lymphocytes Relative: 6 % — ABNORMAL LOW (ref 12–46)
Neutro Abs: 16.1 10*3/uL — ABNORMAL HIGH (ref 1.7–7.7)
Platelets: 219 10*3/uL (ref 150–400)
RDW: 14.7 % (ref 11.5–15.5)
WBC: 18.4 10*3/uL — ABNORMAL HIGH (ref 4.0–10.5)

## 2012-06-22 LAB — PROTIME-INR
INR: 1.09 (ref 0.00–1.49)
Prothrombin Time: 14 seconds (ref 11.6–15.2)

## 2012-06-22 LAB — APTT: aPTT: 32 s (ref 24–37)

## 2012-06-22 MED ORDER — AMLODIPINE BESYLATE 5 MG PO TABS: 5.0000 mg | ORAL_TABLET | Freq: Every day | ORAL | Status: DC

## 2012-06-22 MED ORDER — LORAZEPAM 2 MG/ML IJ SOLN
0.5000 mg | INTRAMUSCULAR | Status: DC | PRN
Start: 2012-06-22 — End: 2012-06-25

## 2012-06-22 MED ORDER — ENSURE COMPLETE PO LIQD: 237.0000 mL | Freq: Two times a day (BID) | ORAL | Status: AC

## 2012-06-22 MED ORDER — ATROPINE SULFATE 1 % OP SOLN
2.0000 [drp] | OPHTHALMIC | Status: DC | PRN
  Filled 2012-06-22: qty 2

## 2012-06-22 MED ORDER — MORPHINE SULFATE 2 MG/ML IJ SOLN
INTRAMUSCULAR | Status: AC
  Filled 2012-06-22: qty 1

## 2012-06-22 MED ORDER — SCOPOLAMINE 1 MG/3DAYS TD PT72
1.0000 | MEDICATED_PATCH | TRANSDERMAL | Status: DC
  Administered 2012-06-22: 1.5 mg via TRANSDERMAL
  Filled 2012-06-22: qty 1

## 2012-06-22 MED ORDER — FINASTERIDE 5 MG PO TABS: 5.0000 mg | ORAL_TABLET | Freq: Every day | ORAL | Status: DC

## 2012-06-22 MED ORDER — MORPHINE SULFATE 2 MG/ML IJ SOLN
1.0000 mg | INTRAMUSCULAR | Status: DC | PRN
  Administered 2012-06-23 – 2012-06-24 (×3): 1 mg via INTRAVENOUS
  Filled 2012-06-22 (×3): qty 1

## 2012-06-22 MED ORDER — MORPHINE SULFATE 2 MG/ML IJ SOLN
2.0000 mg | INTRAMUSCULAR | Status: DC | PRN
  Administered 2012-06-22 (×2): 2 mg via INTRAVENOUS
  Filled 2012-06-22: qty 1

## 2012-06-22 NOTE — Progress Notes (Signed)
Chaplain responded to code blue.  Present for code event.  Pt transferred to ICU.  Chaplain provided support for family upon arrival.  Introduced spiritual care as resource and provided emotional support around pt change in condition.  Family welcoming of chaplain presence.  Informed that they have spiritual guidance.  Will continue to follow and assess needs.    Please page as needs arise.    Belva Crome 12:36 PM

## 2012-06-22 NOTE — Progress Notes (Signed)
Subjective: Awake and talkative. No distress but still feels the urge to urinate   Objective: Lab: Lab Results  Component Value Date   WBC 6.0 06/21/2012   HGB 9.2* 06/21/2012   HCT 25.9* 06/21/2012   MCV 82.5 06/21/2012   PLT 138* 06/21/2012   BMET    Component Value Date/Time   NA 137 06/20/2012 0434   K 4.1 06/20/2012 0434   CL 107 06/20/2012 0434   CO2 25 06/20/2012 0434   GLUCOSE 91 06/20/2012 0434   BUN 7 06/20/2012 0434   CREATININE 1.00 06/22/2012 0359   CALCIUM 8.1* 06/20/2012 0434   GFRNONAA 65* 06/22/2012 0359   GFRAA 75* 06/22/2012 0359     Imaging:  Scheduled Meds: . amLODipine  5 mg Oral Daily  . cefTRIAXone (ROCEPHIN)  IV  1 g Intravenous Daily  . enoxaparin (LOVENOX) injection  40 mg Subcutaneous Q24H  . feeding supplement  237 mL Oral BID BM  . finasteride  5 mg Oral Daily  . latanoprost  1 drop Left Eye QHS  . levobunolol  1 drop Left Eye Daily  . multivitamin with minerals  1 tablet Oral Daily  . sodium chloride  3 mL Intravenous Q12H  . tamsulosin  0.4 mg Oral Daily   Continuous Infusions: . sodium chloride 100 mL/hr at 06/20/12 1500   PRN Meds:.acetaminophen, acetaminophen, LORazepam, ondansetron (ZOFRAN) IV, ondansetron   Physical Exam: Filed Vitals:   06/22/12 0610  BP: 151/87  Pulse: 82  Temp: 99 F (37.2 C)  Resp: 16   See d/c summary     Assessment/Plan Ready for d/c home with HH. For outpatient Urology follow up Dictated #[id number]  Illene Regulus [place] IM (o) [phone]; (c) [phone] Call-grp - Patsi Sears IM  Tele: [phone]  06/22/2012, 8:22 AM

## 2012-06-22 NOTE — Progress Notes (Signed)
Mr. Luczak suffered an acute cardiac arrest: per report he slumped over in his chair, rapid response was called. He was pulseless and CPR was initiated with rapid success after approximately 2 minutes. At 20 minutes he appears to be awake and somewhat responsive. He is noted to have difficulty clearing his secretions. He is hemodynamically stable.  Discussed goals of care and quality of life issues with daughter and primary male in the family: they are clear is wanting comfort measures and to avoid invasive heroic care.  Plan DNR/DNI  MS for pain  Ativan for anxiety  Atropine ophthal soln for secretions and scopolamine patch  Will check back this PM.

## 2012-06-22 NOTE — Progress Notes (Signed)
Received order for hospital bed for home use.  Patient transferred to ICU.  We will continue to follow.

## 2012-06-22 NOTE — Progress Notes (Signed)
CSW spoke with patient's daughter Jalal Rauch at [phone].   Per daughter pt to discharge home with [facility] services.  Family request hosiptal bed be delivered to home before pt discharges home.  Pt daughter said pt may need ambulance transport home; CSW discussed with RNCM who will notify CSW if ambulance transportation is needed.   CSW will compile information for ambulance transport and discuss with RN so that it is available if needed.   No further social work needs identified at this time.  CSW signing off.   Jacklynn Lewis, MSW, LCSWA  Clinical Social Work [phone]

## 2012-06-22 NOTE — Consult Note (Signed)
PULMONARY  / CRITICAL CARE MEDICINE  Name: [name] MRN: [id number] DOB: [birth year]    ADMISSION DATE:  06/15/2012 CONSULTATION DATE:  3/31  REFERRING MD :  Debby Bud PRIMARY SERVICE: Norins  CHIEF COMPLAINT:  Post cardiac arrest  BRIEF PATIENT DESCRIPTION:  79 yo AAM who was admitted to [facility] 3/22 with fever and SOB. Found to have blood and urine cultures + for Klebsiella pneu ss to ceftriaxone. He also was having dysuria. He was to be dc'd home 3-31 when he was found pulseless and apneic. Brief CPR with chest compressions x , BMV and 1 dose  epi. He responded with resumption spontaneous  Heart rate and respirations. He did not require intubation or vasopressor support. PCCM consulted.  SIGNIFICANT EVENTS / STUDIES:  3/31 cardiac arrest  LINES / TUBES:   CULTURES: 3/24  1/2 bc +Kleb ozaenae>>ss roc 3/24 uc>>+Kleb Pneumoniae>>ss roc  ANTIBIOTICS: 3/25 roc>>  HISTORY OF PRESENT ILLNESS:   79 yo AAM who was admitted to [facility] 3/22 with fever and SOB. Found to have blood and urine + for Klebsiella pneu ss to ceftriaxone. He also was having dysuria. He was to be dc'd home 3-31 when he was found pulseless and apneic. Brief CPR with chest compressions x , BMV and 1 dose o epi. He responded with resumtion spontaneous  Heart rte and respirations. He did not require intubation or vasopressor support. PCCM consulted.  Post arrest groggy, denies cp, somewhat sob at 30 deg hob, no obvious asp noted prior to event  PAST MEDICAL HISTORY :  Past Medical History  Diagnosis Date  . Unspecified visual loss   . Blindness of right eye   . Anemia, iron deficiency   . Glaucoma(365)   . Hearing decreased    Past Surgical History  Procedure Laterality Date  . Appendectomy  Age 89  . Cataract extraction      Left 1989, Right 1990   Prior to Admission medications   Medication Sig Start Date End Date Taking? Authorizing Provider  latanoprost (XALATAN) 0.005 % ophthalmic solution Place 1  drop into the left eye at bedtime.    Yes Historical Provider, MD  levobunolol (BETAGAN) 0.5 % ophthalmic solution Place 1 drop into the left eye daily.   Yes Historical Provider, MD  amLODipine (NORVASC) 5 MG tablet Take 1 tablet (5 mg total) by mouth daily. 06/22/12   Jacques Navy, MD  feeding supplement (ENSURE COMPLETE) LIQD Take 237 mLs by mouth 2 (two) times daily between meals. 06/22/12   Jacques Navy, MD  finasteride (PROSCAR) 5 MG tablet Take 1 tablet (5 mg total) by mouth daily. 06/22/12   Jacques Navy, MD   No Known Allergies  FAMILY HISTORY:  Family History  Problem Relation Age of Onset  . Glaucoma Daughter   . Diabetes Father   . Cancer - Other Father     Bone  . Cancer - Other Brother     Liver   SOCIAL HISTORY:  reports that he has never smoked. He has never used smokeless tobacco. He reports that he does not drink alcohol or use illicit drugs.  REVIEW OF SYSTEMS: NA  SUBJECTIVE:   VITAL SIGNS: Temp:  [98.5 F (36.9 C)-99.8 F (37.7 C)] 99 F (37.2 C) (03/31 0610) Pulse Rate:  [80-86] 82 (03/31 0610) Resp:  [16-18] 16 (03/31 0610) BP: (151-173)/(62-87) 151/87 mmHg (03/31 0610) SpO2:  [96 %-100 %] 96 % (03/31 0610) 02 Rx  2lpm   INTAKE /  OUTPUT: Intake/Output     03/30 0701 - 03/31 0700 03/31 0701 - 04/01 0700   P.O. 670    I.V. (mL/kg)     Total Intake(mL/kg) 670 (10.9)    Urine (mL/kg/hr) 3350 (2.3)    Total Output 3350     Net -2680          Stool Occurrence       PHYSICAL EXAMINATION: General: Frail elderly AAM Neuro: MAEx4, follows some coommands HEENT: No JVD Cardiovascular:  HSR Lungs:  Decreased in bases Abdomen:  + bs Musculoskeletal:  intact Skin:  Warm, +le edema  LABS:  Recent Labs Lab 06/15/12 1437 06/15/12 2229 06/16/12 0450  06/17/12 0348 06/18/12 0341 06/19/12 0350 06/19/12 1153 06/20/12 0434 06/21/12 0410 06/22/12 0359 06/22/12 1142 06/22/12 1147  HGB  --   --   --   < > 9.8* 10.9* 9.0* 9.9*  --  9.2*   --  11.0*  --   WBC  --   --   --   < > 11.1* 17.9* 10.4 10.1  --  6.0  --  4.8  --   PLT  --   --   --   < > 111* 133* 120* 133*  --  138*  --  194  --   NA  --   --  139  --  138 138  --   --  137  --   --   --   --   K  --   --  3.9  --  3.8 3.7  --   --  4.1  --   --   --   --   CL  --   --  109  --  107 108  --   --  107  --   --   --   --   CO2  --   --  24  --  24 24  --   --  25  --   --   --   --   GLUCOSE  --   --  76  --  104* 98  --   --  91  --   --   --   --   BUN  --   --  13  --  10 9  --   --  7  --   --   --   --   CREATININE  --   --  1.13  --  1.08 1.10  --   --  0.99  --  1.00  --   --   CALCIUM  --   --  7.9*  --  8.5 8.1*  --   --  8.1*  --   --   --   --   LATICACIDVEN 5.87* 1.3  --   --   --   --   --   --   --   --   --   --   --   TROPONINI  --  <0.30 <0.30  --   --   --   --   --   --   --   --   --   --   PROCALCITON  --  27.51  --   --  26.63  --  6.89  --   --   --   --   --   --   PHART  --   --   --   --   --   --   --   --   --   --   --   --  7.394  PCO2ART  --   --   --   --   --   --   --   --   --   --   --   --  41.4  PO2ART  --   --   --   --   --   --   --   --   --   --   --   --  92.7  < > = values in this interval not displayed.  Recent Labs Lab 06/22/12 1135  GLUCAP 98    Imaging: Dg Chest Port 1 View  06/22/2012  *RADIOLOGY REPORT*  Clinical Data: Post CPR.  Transferred to ICU.  PORTABLE CHEST - 1 VIEW  Comparison: 06/15/2012.  Findings: Trachea is midline.  Heart size stable.  Mild central pulmonary vascular congestion with bibasilar air space disease, left greater than right. Possible small left pleural effusion  IMPRESSION:  1.  Central pulmonary vascular congestion with bibasilar air space disease, left greater than right.  Aspiration is considered. 2.  Possible left pleural effusion.   Original Report Authenticated By: Leanna Battles, M.D.      ASSESSMENT / PLAN:  PULMONARY A: Prob asp>  02 dep resp failure s/p arrest am 3/31 >  nasal 02, NCB  P:  02 rx/ abx per ID section    CARDIOVASCULAR A:Cardiac arrest 3-31     HTN      P:  Check 12 lead  Cardiac enzymes CBG 98 Check lytes Bladder scan    RENAL Lab Results  Component Value Date   CREATININE 1.00 06/22/2012   CREATININE 0.99 06/20/2012   CREATININE 1.10 06/18/2012    A: No acute issue P:     GASTROINTESTINAL A: GI protection P:   ppi  HEMATOLOGIC  Recent Labs  06/21/12 0410 06/22/12 1142  HGB 9.2* 11.0*    A: Iron def anemia P:  Trend h/h  INFECTIOUS A:  Blood and urine + Kleb pn P:   See flowsheet on dashboard May have asp am 3/31 but no need to change abx at this point    NEUROLOGIC A:  ? dementia P:   monitor  TODAY'S SUMMARY:  79 yo AAM who was admitted to [facility] 3/22 with fever and SOB. Found to have blood and urine + for Klebsiella pneu ss to ceftriaxone. He also was having dysuria. He was to be dc'd home 3-31 when he was found pulseless and apneic. Brief CPR with chest compressions x , BMV and 1 dose o epi. He responded with resumtion spontaneous  Heart rte and respirations. He did not require intubation or vasopressor support. PCCM consulted. 3-31 1300 Long discussion with family. Goal of care is now comfort, DNR/DNI established per PCP Dr. Debby Bud.    Brett Canales Minor ACNP Adolph Pollack PCCM Pager [phone] till 3 pm If no answer page [phone] 06/22/2012, 12:03 PM  The patient is critically ill with multiple organ systems failure and requires high complexity decision making for assessment and support, frequent evaluation and titration of therapies, application of advanced monitoring technologies and extensive interpretation of multiple databases. Critical Care Time devoted to patient care services described in this note is 45 minutes.   Sandrea Hughs, MD Pulmonary and Critical Care Medicine [facility] Cell [phone] After 5:30 PM or weekends, call [phone]

## 2012-06-22 NOTE — Care Management (Signed)
Cm spoke with patient's daughter Smokey Melott at [phone]. Per daughter pt to discharge home with [facility] services. Pt active with [facility]. [facility] rep Kristen notified of pt's discharge. Family request hosiptal bed and pt evaluation for power chair. DME order entered. No other needs stated.   Roxy Manns Blayne Frankie,RN,BSN [phone]

## 2012-06-22 NOTE — Progress Notes (Signed)
1213 patient is sitting in chair, RN passed by and noted patient was moving his head from side to side,patient was non responsive to verbal and tactile stimulus. Patient diaphoretic, unable to get BP and pulse, rapid response called , patient was placed in bed , code blue called and CPR started. Patient breathing on his own and bp appreciated after 2 min of resucitation. DR Norrins and family made aware, patient transported to stepdown.

## 2012-06-22 NOTE — Progress Notes (Signed)
PM note  S: patient sitting upright, awake and per family has been talking and laughing. Per RN excessive secretions have cleared. He is "picking" at things a bit.  Objectiive: Vitals are stable including good oxygenation Cor- RRR Pulm - no increased WOB, no wheezing Neuro - awake, speech is clear, he says he doesn't comprehend what is said  A/P 1. ID - is still on rocephin for UTI. Most likely aspiration was an event but no increased WBC or fever - will not add clindamycin or other antibiotics  2. SIRS - resolved  3. Anemia - stable  4. Dementia - a little more confused. Question of after affects of hypoxia  6. GU - continue foley  7. HTN - stable  8. Cardiac arrest - at this point primary suspect was an acute aspiration event with respiratory arrest and cardiac arrest with good recovery. Cardiac enzymes and EKG OK.  Plan will need SLP for swallow eval. Sips and chips till then  Minimize MS and ativan use.

## 2012-06-23 ENCOUNTER — Encounter: Payer: Self-pay | Admitting: Internal Medicine

## 2012-06-23 ENCOUNTER — Telehealth: Payer: Self-pay | Admitting: Internal Medicine

## 2012-06-23 ENCOUNTER — Inpatient Hospital Stay (HOSPITAL_COMMUNITY): Payer: Medicare Other

## 2012-06-23 DIAGNOSIS — I469 Cardiac arrest, cause unspecified: Secondary | ICD-10-CM

## 2012-06-23 LAB — CBC
MCH: 29.4 pg (ref 26.0–34.0)
MCHC: 36 g/dL (ref 30.0–36.0)
MCV: 81.9 fL (ref 78.0–100.0)
Platelets: 177 10*3/uL (ref 150–400)
RDW: 15.2 % (ref 11.5–15.5)
WBC: 13.1 10*3/uL — ABNORMAL HIGH (ref 4.0–10.5)

## 2012-06-23 LAB — BASIC METABOLIC PANEL
Calcium: 8 mg/dL — ABNORMAL LOW (ref 8.4–10.5)
Creatinine, Ser: 1.16 mg/dL (ref 0.50–1.35)
GFR calc Af Amer: 63 mL/min — ABNORMAL LOW (ref >90)
GFR calc non Af Amer: 54 mL/min — ABNORMAL LOW (ref >90)

## 2012-06-23 MED ORDER — ENSURE COMPLETE PO LIQD
237.0000 mL | Freq: Two times a day (BID) | ORAL | Status: DC
  Administered 2012-06-23 – 2012-06-25 (×5): 237 mL via ORAL

## 2012-06-23 NOTE — Progress Notes (Signed)
NUTRITION FOLLOW UP  Intervention:   Provide Ensure Complete BID Continue Multivitamin with minerals daily   Nutrition Dx:   Increased nutrient needs related to underweight and SIRS as evidenced by pt with BMI of 18.3; ongoing, SIRS resolved   Goal:   Pt to meet >/= 90% of their estimated nutrition needs; not met   Monitor:   PO intake; primarily 0-50% per nursing notes Wt; 1 lb wt gain from 3/24 to 3/26  Assessment:   79 year old male presented to the ED with Fever, weight loss, tachypnea, tachycardia and increased confusion. He is admitted with Urinary tract infection, possibly pyelonephritis with SIRS based on fever, tachycardia, tachypnea and chemical markers. 3/25: Pt unable to clearly/accurately answer questions at time of visit. Pt reports having a good appetite and was eating first meal since admission at time of visit. 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 Wt history shows gradual wt loss over 4 years, 13% wt loss in the past 2 years, and wt maintenance for the past 3 months. 4/1: Pt had code blue with CPR yesterday. SIRS has resolved. Pt has modified barium swallowing study today with recommendations for a dysphagia 3 diet with thin liquids. Per daughter in pt's room pt has some reflux with spicy foods. Per daughter pt was eating primarily soft foods, 2 meals daily PTA; pt usually eats a good breakfast, small dinner, and snacks on cookies and crackers.   Height: Ht Readings from Last 1 Encounters:  06/17/12 6\' 1"  (1.854 m)    Weight Status:   Wt Readings from Last 1 Encounters:  06/17/12 136 lb (61.689 kg)    Re-estimated needs:  Kcal: 1590-1840  Protein: 73-86 grams  Fluid: 1.8-2.1 L  Skin: non-pitting RLE and LLE edema  Diet Order: Dysphagia   Intake/Output Summary (Last 24 hours) at 06/23/12 1316 Last data filed at 06/23/12 1100  Gross per 24 hour  Intake   2250 ml  Output    900 ml  Net   1350 ml    Last BM:  3/28   Labs:   Recent Labs Lab 06/20/12 0434 06/22/12 0359 06/22/12 1241 06/23/12 0335  NA 137  --  138 139  K 4.1  --  4.7 4.4  CL 107  --  104 107  CO2 25  --  24 29  BUN 7  --  6 8  CREATININE 0.99 1.00 1.09 1.16  CALCIUM 8.1*  --  8.2* 8.0*  PHOS  --   --   --  3.1  GLUCOSE 91  --  124* 89    CBG (last 3)   Recent Labs  06/22/12 1135  GLUCAP 98    Scheduled Meds: . amLODipine  5 mg Oral Daily  . cefTRIAXone (ROCEPHIN)  IV  1 g Intravenous Daily  . enoxaparin (LOVENOX) injection  40 mg Subcutaneous Q24H  . finasteride  5 mg Oral Daily  . latanoprost  1 drop Left Eye QHS  . levobunolol  1 drop Left Eye Daily  . multivitamin with minerals  1 tablet Oral Daily  . sodium chloride  3 mL Intravenous Q12H  . tamsulosin  0.4 mg Oral Daily    Continuous Infusions: . sodium chloride 1,000 mL (06/23/12 0814)    Ian Malkin RD, LDN Inpatient Clinical Dietitian Pager: [phone] After Hours Pager: [phone]

## 2012-06-23 NOTE — Procedures (Signed)
Objective Swallowing Evaluation: Modified Barium Swallowing Study  Patient Details  Name: [name] MRN: [id number] Date of Birth: Jul 09, 1922  Today's Date: 06/23/2012 Time: 1130-1210 SLP Time Calculation (min): 40 min  Past Medical History:  Past Medical History  Diagnosis Date  . Unspecified visual loss   . Blindness of right eye   . Anemia, iron deficiency   . Glaucoma(365)   . Hearing decreased    Past Surgical History:  Past Surgical History  Procedure Laterality Date  . Appendectomy  Age 76  . Cataract extraction      Left 1989, Right 1990   HPI:  [name] is a 79 y.o. male with past medical history as listed below and ?Dementia lives at home with his daughter and presents with above complaints. The history is obtained from EDP and chart review. Per EMS of family called for fever and shortness of breath. he denies cough, dysuria, shortness of breath chest pain, nausea or vomiting, diarrhea melena and no hematochezia. He states he does not know why he was brought to the ED. In the ED he was found to be febrile to 102.8 BP stable at 142/53, and tachycardic up to the 120s-improved with IV F to the 90s, chest x-ray was done and came back negative for infiltrates and lactic acid level was elevated at 5.87. Urinalysis was done and is consistent with a UTI. He is admitted for further evaluation and management.  Pt. was to be d/c'd home with daughter 3/31 but had a questionable cardiac arrest, vs. acute aspiration event leading to respiratory arrest.  Code was called and pt. had CPR.  Now in ICU with order for swallow evaluation.     Assessment / Plan / Recommendation Clinical Impression  Dysphagia Diagnosis: Suspected primary esophageal dysphagia Clinical impression: Oropharyngeal swallow function is WNL.  No aspiration or penetration was observed, and only minimal coating of barium was observed in the laryngeal spaces after the swallow.  An esophageal sweep did  reveal what  appeared to be stasis of the liquid, with suspected slow emptying, tertiary contractions, and backflow of liquids,i with possible narrowing near the LES.  The 13mm barium tablet passed.      Treatment Recommendation  No treatment recommended at this time    Diet Recommendation Dysphagia 3 (Mechanical Soft);Thin liquid   Liquid Administration via: Cup Medication Administration: Whole meds with liquid Supervision: Staff feed patient Compensations: Slow rate;Small sips/bites;Follow solids with liquid Postural Changes and/or Swallow Maneuvers: Seated upright 90 degrees;Upright 30-60 min after meal    Other  Recommendations Recommended Consults: MBS Oral Care Recommendations: Oral care QID Other Recommendations: Clarify dietary restrictions   Follow Up Recommendations  None    Frequency and Duration        Pertinent Vitals/Pain C/o back pain (RN aware), repositioned pt.    SLP Swallow Goals     General HPI: [name] is a 79 y.o. male with past medical history as listed below and ?Dementia lives at home with his daughter and presents with above complaints. The history is obtained from EDP and chart review. Per EMS of family called for fever and shortness of breath. he denies cough, dysuria, shortness of breath chest pain, nausea or vomiting, diarrhea melena and no hematochezia. He states he does not know why he was brought to the ED. In the ED he was found to be febrile to 102.8 BP stable at 142/53, and tachycardic up to the 120s-improved with IV F to the 90s,  chest x-ray was done and came back negative for infiltrates and lactic acid level was elevated at 5.87. Urinalysis was done and is consistent with a UTI. He is admitted for further evaluation and management.  Pt. was to be d/c'd home with daughter 3/31 but had a questionable cardiac arrest, vs. acute aspiration event leading to respiratory arrest.  Code was called and pt. had CPR.  Now in ICU with order for swallow  evaluation. Type of Study: Modified Barium Swallowing Study Reason for Referral: Objectively evaluate swallowing function Previous Swallow Assessment: BSE earlier today (4/1) Diet Prior to this Study: Regular;Thin liquids (Low sodium; heart healthy) Temperature Spikes Noted: Yes Respiratory Status: Supplemental O2 delivered via (comment) History of Recent Intubation: No Behavior/Cognition: Alert;Cooperative;Pleasant mood;Hard of hearing;Requires cueing Oral Cavity - Dentition: Dentures, top;Dentures, bottom Oral Motor / Sensory Function: Within functional limits Self-Feeding Abilities: Needs set up;Needs assist;Able to feed self Patient Positioning: Upright in chair Baseline Vocal Quality: Clear Volitional Cough: Weak Volitional Swallow: Able to elicit Anatomy: Within functional limits Pharyngeal Secretions: Not observed secondary MBS    Reason for Referral Objectively evaluate swallowing function   Oral Phase Oral Preparation/Oral Phase Oral Phase: WFL   Pharyngeal Phase Pharyngeal Phase Pharyngeal Phase: Within functional limits  Cervical Esophageal Phase    GO    Cervical Esophageal Phase Cervical Esophageal Phase: [name] T 06/23/2012, 12:41 PM

## 2012-06-23 NOTE — Discharge Summary (Signed)
[name], [name] NO.:  [id number]  MEDICAL RECORD NO.:  [id number]  LOCATION:  1227                         FACILITY:  [facility]  PHYSICIAN:  Rosalyn Gess. Norins, MD  DATE OF BIRTH:  [birth year]  DATE OF ADMISSION:  06/15/2012 DATE OF DISCHARGE:                              DISCHARGE SUMMARY   ADMITTING DIAGNOSES: 1. Sepsis from urinary tract infection with systemic inflammatory     response syndrome. 2. Chronic anemia. 3. Dementia. 4. Failure to thrive with loss of weight. 5. Benign prostatic hypertrophy with difficulty voiding. 6. Hypertension.  DISCHARGE DIAGNOSES: 1. Sepsis from urinary tract infection with systemic inflammatory     response syndrome. 2. Chronic anemia. 3. Dementia. 4. Failure to thrive with loss of weight. 5. Benign prostatic hypertrophy with difficulty voiding. 6. Hypertension.  CONSULTANTS:  None.  PROCEDURES:  Chest x-ray two view, March 24th, which showed hyperinflated lungs without acute infiltrate.  Inferior left acromial spur formation, which may predispose the patient to rotator cuff pathology.  HISTORY OF THE PRESENT ILLNESS:  [name] is an 79 year old gentleman with history of blindness, anemia, glaucoma, decreased hearing, and question of early dementia, who lives at home with his daughter.  He presents with a complaint of fever and shortness of breath.  Per EMS, family called for the fever and shortness of breath, but the patient denied cough, dysuria, shortness of breath, chest pain, nausea, vomiting, diarrhea, melena, or hematochezia.  He states he does not know why he was brought to the emergency department.  In the ED, he was found to have a temperature of 102.8, tachycardia up to the 120s.  Chest x-ray was unremarkable.  Lactic acid level was elevated at 5.87.  Urinalysis was done and was consistent with severe UTI.  The patient was admitted with possible SIRS and urinary tract infection.  Please see the H and  P for past medical history, family history, social history, and admission examination.  HOSPITAL COURSE: 1. ID.  The patient was treated with IV vanc and Zosyn and was     switched to IV Rocephin with sensitivities were returned on urinary     tract infection with Klebsiella pneumoniae that was sensitive to     ciprofloxacin and ceftriaxone.  The patient did have procalcitonin     checked at the time of admission, which was elevated.  He did have     a good response to the IV antibiotic therapy and completed 7 of 7     days of Rocephin.  He remained afebrile.  Final CBC from March 30th     revealed a white count down to 6000.  With the patient being     afebrile, white count returning to normal having completed a full     course of antibiotic therapy at this point, he is ready for     discharge to home. 2. Anemia.  The patient has chronic anemia.  He did remain stable     during this hospitalization with hemoglobins in the 9 range.  He     did have an anemia panel done on June 16, 2012, which revealed an     iron  level of 36, which was slightly low.  Iron saturation was     normal at 30%, ferritin was 423, folate was 17.2%, B12 was 438.     This was considered anemia of chronic disease.  It did not require     any additional treatment.  The patient did not need any additional     iron supplementation.  He will continue with iron as an outpatient. 3. Dementia.  The patient with mild dementia, but he is very pleasant.     He is oriented to his physician and location. 4. Failure to thrive.  The patient did take a reasonably good diet     with encouragement.  No swallowing difficulties. 5. GU.  The patient had initial Foley catheter placed and was given a     voiding trial with removal of catheter.  The patient was unable to     void adequately and developed urinary retention with a dilated     bladder.  With replacement of catheter, he had an initial release     of 400 mL of urine.  The  patient had been started on tamsulosin     prior to voiding trial, which was ineffective.  At the time of     replacement of a Foley catheter, finasteride 5 mg daily was added.     At this point, the patient will be discharged to home with a Foley     catheter in place.  I will arrange for him to be seen by Urology as     an outpatient prior to starting another voiding trial. 6. Hypertension.  The patient has had some excursions of hypertension     with systolic blood pressures 180 and above.  He was started on low-     dose amlodipine with good results and this will be continued as an     outpatient. 7. Disposition.  Nursing home care was offered, but the patient's     daughter told she can manage him at home, and therefore he will be     discharged to home.  Home health has been requested for both PT and     OT as well as nursing followup for assistance with care of his     Foley catheter and monitoring his medical condition.  A hospital     bed has been requested.  DISCHARGE PHYSICAL EXAMINATION:  VITAL SIGNS:  Temperature was 99, blood pressure 151/87, pulse 82, respirations 16, O2 saturation 96% on room air. GENERAL APPEARANCE:  This is a very elderly African American gentleman in no acute distress. HEENT:  The patient has opacity of right lens and is blind in his right eye.  The patient has cataract on his left eye, but is able to see. Conjunctivae and sclerae are clear.  Oropharynx without lesions. NECK:  Supple.  No thyromegaly is appreciated. CARDIOVASCULAR:  The patient had 2+ radial pulse.  He had a quiet precordium with a regular rate and rhythm.  No JVD was noted.  No carotid bruits appreciated. PULMONARY:  The patient has no respiratory distress.  He is moving air normally, but no rales or wheezes. ABDOMEN:  The patient has positive bowel sounds in all 4 quadrants.  His abdomen was soft with minimal tenderness, worse in the suprapubic region.  The patient is mildly  protuberant. GENITALIA:  The patient's penis is now normal after having initial significant swelling after placement of Foley catheter.  EXTREMITIES: Without clubbing, cyanosis, or edema.  He is thin. DERM:  The patient has no signs of skin breakdown on head, neck, chest, arms, or legs.  Sacrum was not examined. NEURO:  The patient is awake, alert.  He is oriented to person and to place.  He does recognize his examiner.  Formal mental status testing was not performed.  FINAL LABORATORY DATA:  Prolactin went from 27.51 down to 6.89.  B12 was 438.  Lactic acid went from 5.87 to 1.3.  Creatinine on the 31st was 1.0.  Glucose on the 29th was 91 with sodium 137, potassium 4.1, chloride 107, CO2 of 25, BUN of 7.  Final CBC from March 30th with a white count of 6000, hemoglobin 9.2 g, platelet count 138,000.  DISPOSITION:  The patient is to be discharged to home with home health as noted.  CODE STATUS:  At the time of discharge dictation is full code.  The patient's condition at the time of discharge dictation is stable, but guarded given his advanced age and multiple comorbidities.     Rosalyn Gess Norins, MD     MEN/MEDQ  D:  06/22/2012  T:  06/23/2012  Job:  [id number]

## 2012-06-23 NOTE — Progress Notes (Signed)
Subjective: Easily awakened. In no distress. Talkative and asking when he can go home.  Objective: Lab: Lab Results  Component Value Date   WBC 13.1* 06/23/2012   HGB 9.1* 06/23/2012   HCT 25.3* 06/23/2012   MCV 81.9 06/23/2012   PLT 177 06/23/2012   BMET    Component Value Date/Time   NA 139 06/23/2012 0335   K 4.4 06/23/2012 0335   CL 107 06/23/2012 0335   CO2 29 06/23/2012 0335   GLUCOSE 89 06/23/2012 0335   BUN 8 06/23/2012 0335   CREATININE 1.16 06/23/2012 0335   CALCIUM 8.0* 06/23/2012 0335   GFRNONAA 54* 06/23/2012 0335   GFRAA 63* 06/23/2012 0335   Cardiac Panel (last 3 results)  Recent Labs  06/22/12 1142  TROPONINI <0.30      Imaging: CXR 06/23/12:  IMPRESSION:  Slight worsening aeration. New or increased mild interstitial  edema.  Increased right and similar left base air space disease, which  could represent atelectasis or infection/aspiration.  Left and probable small right pleural effusions.  Scheduled Meds: . amLODipine  5 mg Oral Daily  . cefTRIAXone (ROCEPHIN)  IV  1 g Intravenous Daily  . enoxaparin (LOVENOX) injection  40 mg Subcutaneous Q24H  . finasteride  5 mg Oral Daily  . latanoprost  1 drop Left Eye QHS  . levobunolol  1 drop Left Eye Daily  . multivitamin with minerals  1 tablet Oral Daily  . sodium chloride  3 mL Intravenous Q12H  . tamsulosin  0.4 mg Oral Daily   Continuous Infusions: . sodium chloride 1,000 mL (06/23/12 0814)   PRN Meds:.acetaminophen, acetaminophen, atropine, LORazepam, morphine injection, ondansetron (ZOFRAN) IV, ondansetron   Physical Exam: Filed Vitals:   06/23/12 1010  BP: 145/57  Pulse: 69  Temp: 99.1 F  Resp: 14    Intake/Output Summary (Last 24 hours) at 06/23/12 1226 Last data filed at 06/23/12 1100  Gross per 24 hour  Intake   2350 ml  Output   1000 ml  Net   1350 ml  this adm - +  Gen'l - bundled up with blankets, no distress HEENT- no change Cor- 2+ pulse,  RRR Pulm - normal respirations, no increased WOB, no  rales or wheezes Abd- soft Neuro - easily awakened, speech is at his baseline. Cognition at baseline    Assessment/Plan: 1. ID - mild fever, mild increase in WBC. No other signs of infection. CXR with ASD at bases: infiltrate vs aspiration vs atelectasis. Plan Will hold antibiotics at this time  F/u CBCD in AM  4. Dementia - at his baseline, which is pretty good  6. GU - still with foley. May try voiding trial before d/c  7. HTN - adequate control  8. Cardiac arrest - cause is vague - possibly aspiration although his swallow eval with MBS is normal. He has made a full recovery.  Plan - transfer to tele bed   Illene Regulus [facility] (o) [phone]; (c) [phone] Call-grp - Patsi Sears IM  Tele: [phone]  06/23/2012, 12:19 PM

## 2012-06-23 NOTE — Telephone Encounter (Signed)
On the day of discharge - Monday he had a cardiac arrest - was successfully resuscitated and is currently in ICU. Hopefully home later this week.

## 2012-06-23 NOTE — Telephone Encounter (Signed)
Tried calling only number listed to schedule appointment, no answer and no voice mail picked up.  Will try back soon if call is not returned.

## 2012-06-23 NOTE — Progress Notes (Signed)
Physical Therapy Treatment Patient Details Name: [name] MRN: [id number] DOB: [birth year] Today's Date: 06/23/2012 Time: 8295-6213 PT Time Calculation (min): 25 min  PT Assessment / Plan / Recommendation Comments on Treatment Session  Noted pt had code blue with CPR yesterday, pt sore in R ribs today. Bed to chair with +2 assist, pt decreased responsiveness after transfer. RN alerted. BP 92/48, HR 87, SaO2 88% on RA, applied 2L O2. Per RN decreased responsiveness likely 2* recent dose of morphine.     Follow Up Recommendations  SNF;Supervision/Assistance - 24 hour     Does the patient have the potential to tolerate intense rehabilitation     Barriers to Discharge        Equipment Recommendations  Rolling walker with 5" wheels    Recommendations for Other Services    Frequency Min 3X/week   Plan Discharge plan remains appropriate;Frequency remains appropriate    Precautions / Restrictions Precautions Precautions: Fall Precaution Comments: visually impaired. (blind in right eye) Restrictions Weight Bearing Restrictions: No   Pertinent Vitals/Pain **Pt c/o pain in R lateral chest, likely due to CPR yesterday per RN Pain meds requested, pt unable to rate pain but stated its worse with movement BP 92/48, HR 87, SaO2 88% on RA, 100% on 3L O2 [place]*    Mobility  Bed Mobility Bed Mobility: Supine to Sit Supine to Sit: 1: +2 Total assist Supine to Sit: Patient Percentage: 50% Details for Bed Mobility Assistance: assist to elevate trunk and advance BLEs Transfers Transfers: Sit to Stand;Stand to Sit;Stand Pivot Transfers Sit to Stand: With upper extremity assist;From bed;1: +2 Total assist Sit to Stand: Patient Percentage: 50% Stand to Sit: With upper extremity assist;With armrests;To chair/3-in-1;1: +2 Total assist Stand to Sit: Patient Percentage: 50% Stand Pivot Transfers: 1: +2 Total assist Stand Pivot Transfers: Patient Percentage: 70% Details for Transfer Assistance:  Assist to rise and to control descent; SPT with RW Ambulation/Gait Ambulation/Gait Assistance: Not tested (comment)    Exercises     PT Diagnosis:    PT Problem List:   PT Treatment Interventions:     PT Goals Acute Rehab PT Goals PT Goal Formulation: With patient Time For Goal Achievement: 06/30/12 Potential to Achieve Goals: Good Pt will go Supine/Side to Sit: with min assist PT Goal: Supine/Side to Sit - Progress: Revised due to lack of progress Pt will go Sit to Stand: with min assist PT Goal: Sit to Stand - Progress: Revised due to lack of progress Pt will go Stand to Sit: with min assist PT Goal: Stand to Sit - Progress: Revised due to lack of progress Pt will Ambulate: 16 - 50 feet;with min assist;with rolling walker PT Goal: Ambulate - Progress: Revised due to lack of progress  Visit Information  Last PT Received On: 06/23/12 Assistance Needed: +2    Subjective Data  Subjective: I'll do whatever you say.  Patient Stated Goal: none stated   Cognition  Cognition Overall Cognitive Status: Appears within functional limits for tasks assessed/performed Arousal/Alertness: Lethargic Orientation Level: Appears intact for tasks assessed Behavior During Session: Lethargic Cognition - Other Comments: Mostly WFL during eval.  Pt had period of decreased responsiveness after transfer to chair. RN aware. Pt had just received morphine. SaO2 88% on RA,  Applied 3L O2, BP 92/48. HR 87.    Balance  Balance Balance Assessed: Yes Static Sitting Balance Static Sitting - Balance Support: Bilateral upper extremity supported;Feet supported Static Sitting - Level of Assistance: 5: Stand by assistance Static Sitting -  Comment/# of Minutes: 3  End of Session PT - End of Session Equipment Utilized During Treatment: Gait belt Activity Tolerance: Patient limited by fatigue Patient left: in chair;with call bell/phone within reach Nurse Communication: Mobility status   GP     Ralene Bathe Kistler 06/23/2012, 10:45 AM [phone]

## 2012-06-23 NOTE — Evaluation (Signed)
Clinical/Bedside Swallow Evaluation Patient Details  Name: [name] MRN: [id number] Date of Birth: May 27, 1922  Today's Date: 06/23/2012 Time: 0915-     Past Medical History:  Past Medical History  Diagnosis Date  . Unspecified visual loss   . Blindness of right eye   . Anemia, iron deficiency   . Glaucoma(365)   . Hearing decreased    Past Surgical History:  Past Surgical History  Procedure Laterality Date  . Appendectomy  Age 79  . Cataract extraction      Left 1989, Right 1990   HPI:  [name] is a 79 y.o. male with past medical history as listed below and ?Dementia lives at home with his daughter and presents with above complaints. The history is obtained from EDP and chart review. Per EMS of family called for fever and shortness of breath. he denies cough, dysuria, shortness of breath chest pain, nausea or vomiting, diarrhea melena and no hematochezia. He states he does not know why he was brought to the ED. In the ED he was found to be febrile to 102.8 BP stable at 142/53, and tachycardic up to the 120s-improved with IV F to the 90s, chest x-ray was done and came back negative for infiltrates and lactic acid level was elevated at 5.87. Urinalysis was done and is consistent with a UTI. He is admitted for further evaluation and management.  Pt. was to be d/c'd home with daughter 3/31 but had a questionable cardiac arrest, vs. acute aspiration event leading to respiratory arrest.  Code was called and pt. had CPR.  Now in ICU with order for swallow evaluation.   Assessment / Plan / Recommendation Clinical Impression  Patient with a few potential s/s of aspiration noted at bedside, including wet/gurgly vocal quality after swallowing thin liquids, and intermittent audible throat clearing and double swallows after cracker.  In light of questionalbe acute aspiration event leading to respiratory arrest on 06/22/12, ongoing fevers, and CXR with possible aspiration/infection, an  objective swallow study is indicated to r/o aspiration, and/or determine safest diet.    Aspiration Risk  Mild    Diet Recommendation NPO except meds        Other  Recommendations Recommended Consults: MBS   Follow Up Recommendations       Frequency and Duration        Pertinent Vitals/Pain C/O pain in mid-back.  Repositioned and improved.  RN notified; morphine just given.  Pain likely due secondary to CPR.    SLP Swallow Goals     Swallow Study Prior Functional Status       General HPI: [name] is a 79 y.o. male with past medical history as listed below and ?Dementia lives at home with his daughter and presents with above complaints. The history is obtained from EDP and chart review. Per EMS of family called for fever and shortness of breath. he denies cough, dysuria, shortness of breath chest pain, nausea or vomiting, diarrhea melena and no hematochezia. He states he does not know why he was brought to the ED. In the ED he was found to be febrile to 102.8 BP stable at 142/53, and tachycardic up to the 120s-improved with IV F to the 90s, chest x-ray was done and came back negative for infiltrates and lactic acid level was elevated at 5.87. Urinalysis was done and is consistent with a UTI. He is admitted for further evaluation and management.  Pt. was to be d/c'd home with daughter 3/31  but had a questionable cardiac arrest, vs. acute aspiration event leading to respiratory arrest.  Code was called and pt. had CPR.  Now in ICU with order for swallow evaluation. Type of Study: Bedside swallow evaluation Previous Swallow Assessment: None found in [facility] Diet Prior to this Study: Thin liquids (Clear Liquids currently.) Temperature Spikes Noted: Yes Respiratory Status: Supplemental O2 delivered via (comment) History of Recent Intubation: No Behavior/Cognition: Alert;Cooperative;Pleasant mood;Hard of hearing;Requires cueing Oral Cavity - Dentition: Dentures, top;Dentures,  bottom Self-Feeding Abilities: Needs assist Patient Positioning: Upright in chair Baseline Vocal Quality: Clear Volitional Cough: Weak Volitional Swallow: Able to elicit    Oral/Motor/Sensory Function Overall Oral Motor/Sensory Function: Appears within functional limits for tasks assessed   Ice Chips Ice chips: Within functional limits Presentation: Spoon   Thin Liquid Thin Liquid: Impaired Presentation: Spoon;Cup;Straw Pharyngeal  Phase Impairments: Wet Vocal Quality;Throat Clearing - Delayed    Nectar Thick Nectar Thick Liquid: Not tested   Honey Thick Honey Thick Liquid: Not tested   Puree Puree: Within functional limits Presentation: Spoon   Solid   GO    Solid: Impaired Presentation: Self Fed Pharyngeal Phase Impairments: Multiple swallows;Throat Clearing - Delayed       [name] T 06/23/2012,10:14 AM

## 2012-06-23 NOTE — Telephone Encounter (Signed)
Message copied by Newell Coral on Tue Jun 23, 2012  9:34 AM ------      Message from: Illene Regulus E      Created: Mon Jun 22, 2012  8:44 AM       1. Transition home after UTI/Sepsis. Had urinary retention and went home with foley . HH to follow. GU appointment requested            2. F/u office visit in 1 week.             Thanks ------

## 2012-06-24 LAB — BASIC METABOLIC PANEL
Calcium: 8.2 mg/dL — ABNORMAL LOW (ref 8.4–10.5)
GFR calc non Af Amer: 65 mL/min — ABNORMAL LOW (ref >90)
Sodium: 135 mEq/L (ref 135–145)

## 2012-06-24 LAB — CBC WITH DIFFERENTIAL/PLATELET
Basophils Absolute: 0 10*3/uL (ref 0.0–0.1)
HCT: 24.7 % — ABNORMAL LOW (ref 39.0–52.0)
Hemoglobin: 8.8 g/dL — ABNORMAL LOW (ref 13.0–17.0)
Lymphocytes Relative: 23 % (ref 12–46)
Monocytes Absolute: 0.9 10*3/uL (ref 0.1–1.0)
Monocytes Relative: 10 % (ref 3–12)
Neutro Abs: 5.7 10*3/uL (ref 1.7–7.7)
Neutrophils Relative %: 65 % (ref 43–77)
RDW: 15.5 % (ref 11.5–15.5)
WBC: 8.8 10*3/uL (ref 4.0–10.5)

## 2012-06-24 MED ORDER — ENOXAPARIN SODIUM 40 MG/0.4ML ~~LOC~~ SOLN
40.0000 mg | SUBCUTANEOUS | Status: DC
  Administered 2012-06-24: 40 mg via SUBCUTANEOUS
  Filled 2012-06-24 (×2): qty 0.4

## 2012-06-24 NOTE — Progress Notes (Signed)
CSW spoke with patient's daughter, [name] (cell#: [phone]) re: discharge planning. Daughter states that she still plans to take him home at discharge, rather than SNF. RNCM, Steward Drone made aware & is arranging home health services & equipment. Anticipating discharge Thursday or Friday. CSW signing off.   Unice Bailey, LCSW [facility] Clinical Social Worker cell #: [phone]

## 2012-06-24 NOTE — Progress Notes (Signed)
Subjective: [name] is found dozing in his chair. He is easily awakened. He complains of pain in the right buttock.  Objective: Lab: Lab Results  Component Value Date   WBC 8.8 06/24/2012   HGB 8.8* 06/24/2012   HCT 24.7* 06/24/2012   MCV 82.6 06/24/2012   PLT 170 06/24/2012   BMET    Component Value Date/Time   NA 135 06/24/2012 0440   K 4.0 06/24/2012 0440   CL 105 06/24/2012 0440   CO2 28 06/24/2012 0440   GLUCOSE 91 06/24/2012 0440   BUN 8 06/24/2012 0440   CREATININE 1.00 06/24/2012 0440   CALCIUM 8.2* 06/24/2012 0440   GFRNONAA 65* 06/24/2012 0440   GFRAA 75* 06/24/2012 0440     Imaging: Swallow eval w/ BaS:   Clinical Impression  Dysphagia Diagnosis: Suspected primary esophageal dysphagia  Clinical impression: Oropharyngeal swallow function is WNL. No aspiration or penetration was observed, and only minimal coating of barium was observed in the laryngeal spaces after the swallow. An esophageal sweep did reveal what appeared to be stasis of the liquid, with suspected slow emptying, tertiary contractions, and backflow of liquids,i with possible narrowing near the LES. The 13mm barium tablet passed.  Treatment Recommendation  No treatment recommended at this time  Diet Recommendation Dysphagia 3 (Mechanical Soft);Thin liquid  Liquid Administration via: Cup  Medication Administration: Whole meds with liquid  Supervision: Staff feed patient  Compensations: Slow rate;Small sips/bites;Follow solids with liquid  Postural Changes and/or Swallow Maneuvers: Seated upright 90 degrees;Upright 30-60 min after meal   Scheduled Meds: . amLODipine  5 mg Oral Daily  . cefTRIAXone (ROCEPHIN)  IV  1 g Intravenous Daily  . enoxaparin (LOVENOX) injection  40 mg Subcutaneous Q24H  . feeding supplement  237 mL Oral BID BM  . finasteride  5 mg Oral Daily  . latanoprost  1 drop Left Eye QHS  . levobunolol  1 drop Left Eye Daily  . multivitamin with minerals  1 tablet Oral Daily  . sodium chloride  3 mL  Intravenous Q12H  . tamsulosin  0.4 mg Oral Daily   Continuous Infusions: . sodium chloride 100 mL/hr at 06/24/12 0423   PRN Meds:.acetaminophen, acetaminophen, atropine, LORazepam, morphine injection, ondansetron (ZOFRAN) IV, ondansetron   Physical Exam: Filed Vitals:   06/24/12 0503  BP: 137/56  Pulse: 85  Temp: 98.9 F (37.2 C)  Resp: 18   gen'l - very elderly AA man sitting in a geri-chair in no acute distress except for pain in the right buttock HEENT- no change Cor 2+ radial pulse, RRR Pulm - good breath sounds, no rales or wheezes, no increased WOB Abd- Soft, BS+, no guarding Genitalia - foley catheter in place Neuro - speech is clear.    Intake/Output Summary (Last 24 hours) at 06/24/12 1232 Last data filed at 06/24/12 1000  Gross per 24 hour  Intake 2346.66 ml  Output   1450 ml  Net 896.66 ml  This adm: +3,146 ml       Assessment/Plan: 1. ID - completed antibiotics for UTI. He has not been started on antibiotics after arrest. He is afebril and WBC's are normal. No evidence of active infections  2. Anemia - slight drop in Hgb to 8.8, possibly due to hydration. He is asymptomatic and has chronic anemia with Hgb around 9g.  3. Dementia - at his baseline. No perceptible set back/worsening after event  4. FTT- does well with help. Swallow study reviewed. He will need to be fed his  meals as directed by SLP  5. GU - he has been on Flomax for several days and Proscar. Plan Voiding trial - d/c foley. Scan bladder if no urine output at 4 hours  6. Cardiac - complete recovery from cardiac arrest. BP stable  7. Cardiac arrest - full recovery  Anticipate D/C in AM   [employer] IM (o) [phone]; (c) [phone] Call-grp - Patsi [facility] IM  Tele: [phone]  06/24/2012, 12:31 PM

## 2012-06-24 NOTE — Progress Notes (Signed)
Physical Therapy Treatment Patient Details Name: [name] MRN: [id number] DOB: [birth year] Today's Date: 06/24/2012 Time: 1540-1600 PT Time Calculation (min): 20 min  PT Assessment / Plan / Recommendation Comments on Treatment Session  Pt feeling better today and able to ambulate in hallway with min assist (+2 for safety and following with recliner).      Follow Up Recommendations  SNF;Supervision/Assistance - 24 hour     Does the patient have the potential to tolerate intense rehabilitation     Barriers to Discharge        Equipment Recommendations  Rolling walker with 5" wheels    Recommendations for Other Services    Frequency     Plan Discharge plan remains appropriate;Frequency remains appropriate    Precautions / Restrictions Precautions Precaution Comments: visually impaired. (blind in right eye)   Pertinent Vitals/Pain Pt denies pain    Mobility  Bed Mobility Bed Mobility: Supine to Sit;Sitting - Scoot to [place] of Bed;Sit to Supine Supine to Sit: 4: Min assist Sitting - Scoot to Edge of Bed: 3: Mod assist Sit to Supine: 3: Mod assist Details for Bed Mobility Assistance: assist mainly to scoot to EOB and bring LEs onto bed, verbal cues for technique Transfers Transfers: Sit to Stand;Stand to Sit Sit to Stand: 4: Min assist;With upper extremity assist;From elevated surface;From chair/3-in-1;From bed Stand to Sit: To bed;To chair/3-in-1;4: Min assist;With upper extremity assist Details for Transfer Assistance: verbal cues for technique, assist to rise and control descent Ambulation/Gait Ambulation/Gait Assistance: 4: Min assist Ambulation Distance (Feet): 80 Feet (x2) Ambulation/Gait Assistance Details: 23' x2 with seated rest break, HR 101 upon sitting during rest break with SaO2 97%, verbal cues for RW distance, posture Gait Pattern: Step-through pattern;Trunk flexed;Decreased stride length Gait velocity: decreased    Exercises     PT Diagnosis:    PT  Problem List:   PT Treatment Interventions:     PT Goals Acute Rehab PT Goals PT Goal: Supine/Side to Sit - Progress: Progressing toward goal PT Goal: Sit to Supine/Side - Progress: Progressing toward goal PT Goal: Sit to Stand - Progress: Progressing toward goal PT Goal: Stand to Sit - Progress: Progressing toward goal Pt will Ambulate: 51 - 150 feet;with supervision;with rolling walker PT Goal: Ambulate - Progress: Updated due to goal met  Visit Information  Last PT Received On: 06/24/12 Assistance Needed: +2    Subjective Data  Subjective: I'm cold (likes lots of blankets)   Cognition  Cognition Overall Cognitive Status: No family/caregiver present to determine baseline cognitive functioning Arousal/Alertness: Awake/alert Orientation Level: Appears intact for tasks assessed Behavior During Session: [facility] for tasks performed    Balance     End of Session PT - End of Session Equipment Utilized During Treatment: Gait belt Activity Tolerance: Patient limited by fatigue Patient left: in bed;with call bell/phone within reach   GP     Cierra Rothgeb,KATHrine E 06/24/2012, 4:40 PM Zenovia Jarred, PT, DPT 06/24/2012 Pager: [phone]

## 2012-06-25 ENCOUNTER — Telehealth: Payer: Self-pay | Admitting: General Practice

## 2012-06-25 MED ORDER — TAMSULOSIN HCL 0.4 MG PO CAPS: 0.4000 mg | ORAL_CAPSULE | Freq: Every day | ORAL | Status: DC

## 2012-06-25 NOTE — Progress Notes (Signed)
Per Hillard Danker with [facility], they contacted the patient's daughter and informed her that the hospital bed will be delivered today between 3-5pm; the power chair that was ordered is a process to get insurance approval that can take up to 3-4 weeks and more paper work for the Attending MD to complete. [facility] will continue to contact the patient/ daughter after discharge to continue to get approval for the power chair.

## 2012-06-25 NOTE — Progress Notes (Signed)
Spoke with daughter [name] on the phone about discharge. She is okay for ambulance transport to be here at [facility] at 1700.  Will continue to monitor patient. Setzer, Don Broach

## 2012-06-25 NOTE — Progress Notes (Signed)
Subjective: Per RN report patient able to void, no complaints of lower abdominal pain or signs of retention.  Objective: Lab: Lab Results  Component Value Date   WBC 8.8 06/24/2012   HGB 8.8* 06/24/2012   HCT 24.7* 06/24/2012   MCV 82.6 06/24/2012   PLT 170 06/24/2012   BMET    Component Value Date/Time   NA 135 06/24/2012 0440   K 4.0 06/24/2012 0440   CL 105 06/24/2012 0440   CO2 28 06/24/2012 0440   GLUCOSE 91 06/24/2012 0440   BUN 8 06/24/2012 0440   CREATININE 1.00 06/24/2012 0440   CALCIUM 8.2* 06/24/2012 0440   GFRNONAA 65* 06/24/2012 0440   GFRAA 75* 06/24/2012 0440     Imaging:  Scheduled Meds: . amLODipine  5 mg Oral Daily  . cefTRIAXone (ROCEPHIN)  IV  1 g Intravenous Daily  . enoxaparin (LOVENOX) injection  40 mg Subcutaneous Q24H  . feeding supplement  237 mL Oral BID BM  . finasteride  5 mg Oral Daily  . latanoprost  1 drop Left Eye QHS  . levobunolol  1 drop Left Eye Daily  . multivitamin with minerals  1 tablet Oral Daily  . sodium chloride  3 mL Intravenous Q12H  . tamsulosin  0.4 mg Oral Daily   Continuous Infusions: . sodium chloride 100 mL/hr at 06/25/12 0123   PRN Meds:.acetaminophen, acetaminophen, atropine, LORazepam, morphine injection, ondansetron (ZOFRAN) IV, ondansetron   Physical Exam: Filed Vitals:   06/25/12 0500  BP: 148/65  Pulse: 70  Temp: 98.8 F (37.1 C)  Resp: 14        Assessment/Plan: Ready for discharge. Addendum #[id number]   Illene Regulus [place] IM (o) [phone]; (c) [phone] Call-grp - Patsi Sears IM  Tele: [phone]  06/25/2012, 7:22 AM

## 2012-06-25 NOTE — Telephone Encounter (Signed)
Attempted Transitional care call.  LMOM for pt to call office for follow up visit with Dr. Debby Bud.

## 2012-06-25 NOTE — Progress Notes (Signed)
CSW received phone call from pt daughter requesting ambulance transport home.  CSW discussed with RN and ambulance transportation arranged for 5 pm.  Pt daughter notified via telephone.   No further social work needs identified at this time.  CSW signing off.   Jacklynn Lewis, MSW, LCSWA (coverage for Unice Bailey, [place] [phone]) Clinical Social Work

## 2012-06-26 NOTE — Discharge Summary (Signed)
NAMEJAHSHUA, BONITO NO.:  [id number]  MEDICAL RECORD NO.:  [id number]  LOCATION:  [id number]                         FACILITY:  [facility]  PHYSICIAN:  Rosalyn Gess. Drystan Reader, MD  DATE OF BIRTH:  01-16-1923  DATE OF ADMISSION:  06/15/2012 DATE OF DISCHARGE:  06/25/2012                              DISCHARGE SUMMARY   ADDENDUM:  The patient was ready for discharge to home on Tuesday, May 23, 2012.  He unfortunately had a cardiac arrest.  The patient had been sitting in his chair eating, was noted by the nursing staff to be slumped over, was found to be pulseless.  He was moved from chair to bed and CPR was initiated.  The patient had a rapid response to treatment with return of a spontaneous heartbeat.  The patient was transferred to the ICU.  He continued to do well.  His cardiac enzymes were negative.  He rapidly regained his normal level of mental functioning.  The patient did have evidence on chest x-ray of a possible aspiration. However, he remained afebrile.  White count was minimally elevated and then returned to normal.  Antibiotics were not re-initiated.  Because of the concern for aspiration as a precipitating cause of his event, he was seen by Speech Pathology and had a barium swallow.  This revealed the patient to have a reasonably normal swallow but esophageal sweep did reveal what appeared to be stasis of liquid, suspected slow emptying and tertiary contractions of backflow liquid and possibly a narrowing of the LES.  A 13-mm barium tablet did pass easily. Recommendations were for dysphagia 3 mechanical soft diet with thin liquids with observation and assistance with feeding and no straws.  The patient was able to be moved out of the intensive care to a regular telemetry bed where he continued to do well.  ASSESSMENT AND PLAN:  Please see the discharge summary.  The patient was given a voiding trial on June 24, 2012 with Foley catheter removed.  Per  nursing staff I's and O's indicated he was able to void without difficulty.  He did not have any complaints of lower abdominal pain or discomfort.  The patient will be able to be discharged home without a Foley catheter but will continue on finasteride and tamsulosin.  The patient's other medical problems did remain stable and at this point, he is ready for transfer to home.  DISCHARGE MEDICATIONS: 1. Atropine eye drops 2-4 drops to eye q.2 p.r.n. 2. Xalatan eyedrops to the left side at bedtime. 3. Betagan eyedrops 0.5% to left eye daily. 4. Amlodipine 5 mg daily. 5. Ensure complete 2 cans daily. 6. Finasteride 5 mg daily. 7. Tamsulosin 4 mg daily.  Home health has been ordered already for medical followup.  PT and OT for rehab and strengthening.  DME orders have been placed for a hospital bed at home.  CODE STATUS:  After the patient's cardiac arrest and after prolonged discussion with the patient's family, at this point, he is not a candidate for cardiac resuscitation or mechanical intubation.  DISCHARGE EXAMINATION:  VITAL SIGNS:  Temperature was 98.8, blood pressure 148/65, pulse was 70, respirations 14, oxygen saturations 96% on  room air. GENERAL APPEARANCE:  This is a very elderly African American gentleman lying in bed.  He is awake and oriented, although a little confused. HEENT:  Right eye with totally opacified lens.  Left eye with significant cataract.  Conjunctiva and sclerae were clear.  Oropharynx without lesions. NECK:  Supple. CHEST:  The patient without deformity.  He does have some tenderness in the anterior chest wall after CPR. PULMONARY:  The patient has no increased work of breathing.  Normal respirations were noted.  No rales, wheezes or rhonchi were appreciated. CARDIOVASCULAR:  2+ radial pulses.  Precordium was quiet.  He had a regular rate and rhythm.  I did not appreciate a murmur. ABDOMEN:  Soft.  Bowel sounds were positive in all 4 quadrants.   There was no guarding.  There was mild tenderness to palpation and was diffuse.  There was no increased dullness to percussion in the infraumbilical region. GENITALIA:  Normal male phallus, uncircumcised without swelling or edema. EXTREMITIES:  No deformities were noted. SKIN:  No skin breakdown was appreciated. NEURO:  The patient is easily awakened.  He is oriented to doctor and location and does not have a deep understanding of the events that transpired.  FINAL LABORATORY:  The patient had cardiac enzymes, troponins that were negative x3.  He had a BNP on June 22, 2012, that was 1702.  Final chemistry from June 24, 2012, with a sodium 135, potassium 4, chloride 105, CO2 28, BUN of 8, creatinine of 1, glucose was 91.  Final liver functions from June 22, 2012, were normal with a mildly low albumin at 1.8.  Total protein was low at 5.1.  Total bilirubin was low at 0.2. Final CBC from June 24, 2012 with a white count of 8800, hemoglobin was 8.8 g, hematocrit 24.7%, platelet count 170,000, he had a normal differential.  Final chest x-ray from June 23, 2012 at 5:24 a.m. read out slight worsening aeration.  New or increased mild interstitial edema.  Increased right and some left base airspace disease could represent atelectasis and probable small right pleural effusion are noted.  FINAL DISPOSITION:  The patient is to be discharged to home.  He is asked to be seen in the office in 5-7 days.  The office will call to set up an appointment.  The patient's condition at the time of discharge dictation is stable but guarded given his multiple comorbidities.     Rosalyn Gess Judson Tsan, MD     MEN/MEDQ  D:  06/25/2012  T:  06/26/2012  Job:  [id number]

## 2012-06-29 ENCOUNTER — Telehealth: Payer: Self-pay | Admitting: Internal Medicine

## 2012-06-29 NOTE — Telephone Encounter (Signed)
Message copied by Newell Coral on Mon Jun 29, 2012  8:43 AM ------      Message from: Illene Regulus E      Created: Thu Jun 25, 2012  9:48 AM       1. Transition - hosp for UTI, SIRS, Cardiac arrest. Check on chest pain, ability to void      2. F/u office visit Monday or tuesday ------

## 2012-06-29 NOTE — Telephone Encounter (Signed)
Follow up appointment made.

## 2012-07-01 ENCOUNTER — Ambulatory Visit: Payer: Medicare Other | Admitting: Internal Medicine

## 2012-07-02 ENCOUNTER — Telehealth: Payer: Self-pay

## 2012-07-02 NOTE — Telephone Encounter (Signed)
[name] called stating she had to cancel post hospital f/up appt with [facility] because she is unable to transport pt to office. [name] states pt needs to be seen - is [facility] willing to do home visit for this pt? Please advise

## 2012-07-02 NOTE — Telephone Encounter (Signed)
Can do home visit tomorrow, April 11th - probably late afternoon. I will need directions

## 2012-07-03 ENCOUNTER — Encounter: Payer: Self-pay | Admitting: Internal Medicine

## 2012-07-03 DIAGNOSIS — I469 Cardiac arrest, cause unspecified: Secondary | ICD-10-CM

## 2012-07-03 DIAGNOSIS — I10 Essential (primary) hypertension: Secondary | ICD-10-CM

## 2012-07-03 DIAGNOSIS — H547 Unspecified visual loss: Secondary | ICD-10-CM

## 2012-07-03 DIAGNOSIS — R531 Weakness: Secondary | ICD-10-CM

## 2012-07-03 NOTE — Assessment & Plan Note (Signed)
Casue unknwon - Cardiac enzymes adn cardiology evluation were negative. He is at high risk  Risk modifications

## 2012-07-03 NOTE — Assessment & Plan Note (Signed)
Progressive weakness and inanition.  Plan - continue supportive care  Home Health - RN 2/wk, PT eval and treat, wound care consult, pressure reduction cushion for his chair, BSC

## 2012-07-03 NOTE — Progress Notes (Signed)
Subjective:    Patient ID: [name], male    DOB: 1923-01-21, 79 y.o.   MRN: [id number]  HPI [name] is seen at his home for follow up after recent admission for Urinary sepsis with confusion and for LOC/Cardiac arrest with CPR and Intensive care resuscitation. Please see hospital d/c summary.  Since being home he reports that he has eaten and daughter corroborates that he eats, but doesn't do well with meats. She reports they have been following swallowing safety protocol. He has not choked nor had any respiratory distress. He has been trying to get to the bathroom which is hard. He has used the trash can in his room on several occasions as a BSC  HH-RN has come out twice since d/c to home. They are awaiting PT eval.   [name] has very limited mobility in this small house. His room is in the back hall. He has TV, chair and bed. Daughter is requesting [facility] and poosibly light-weight wheelchair for in-home mobility. She reports area of skin breakdown at the near rectal position  Past Medical History  Diagnosis Date  . Unspecified visual loss   . Blindness of right eye   . Anemia, iron deficiency   . Glaucoma(365)   . Hearing decreased    Past Surgical History  Procedure Laterality Date  . Appendectomy  Age 57  . Cataract extraction      Left 1989, Right 1990   Family History  Problem Relation Age of Onset  . Glaucoma Daughter   . Diabetes Father   . Cancer - Other Father     Bone  . Cancer - Other Brother     Liver   History   Social History  . Marital Status: Widowed    Spouse Name: N/A    Number of Children: 3  . Years of Education: N/A   Occupational History  . Retired Visual merchandiser    Social History Main Topics  . Smoking status: Never Smoker   . Smokeless tobacco: Never Used  . Alcohol Use: No  . Drug Use: No  . Sexually Active:    Other Topics Concern  . 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   His daughters live with him along with their children in a large home he had built: 5 bedrooms, 3 1/2 baths.      END OF LIFE CARE: No CPR, No prolonged ventilatory support; no heroic or futile measures.          Current Outpatient Prescriptions on File Prior to Visit  Medication Sig Dispense Refill  . amLODipine (NORVASC) 5 MG tablet Take 1 tablet (5 mg total) by mouth daily.  30 tablet  5  . feeding supplement (ENSURE COMPLETE) LIQD Take 237 mLs by mouth 2 (two) times daily between meals.  60 Bottle  5  . finasteride (PROSCAR) 5 MG tablet Take 1 tablet (5 mg total) by mouth daily.  30 tablet  11  . latanoprost (XALATAN) 0.005 % ophthalmic solution Place 1 drop into the left eye at bedtime.       Marland Kitchen levobunolol (BETAGAN) 0.5 % ophthalmic solution Place 1 drop into  the left eye daily.      . tamsulosin (FLOMAX) 0.4 MG CAPS Take 1 capsule (0.4 mg total) by mouth daily.  30 capsule  11   No current facility-administered medications on file prior to visit.       Review of Systems System review is negative for any constitutional, cardiac, pulmonary, GI or neuro symptoms or complaints other than as described in the HPI.     Objective:   Physical Exam  BP 11-/75, HR 68 regular Gen'l - very elderly and frail AA man sitting in his chair in no distress. HEENT- blind OD, left OS appears normal , C&S clear Neck -supple, no JVD in sitting position Cor 2+ radial pulse, quiet precordium, RRR Pulm - no rales or wheezes, not moving a lot of air. Abd - BS+, soft, w/o guarding or rebound Ext - no deformity but thin Neuro - Awake, Seems alert, speech is appropriate and intelligible. No tremor       Assessment & Plan:  1. Urinary sepsis - good recovery with no  lingering symptoms  2. Cardiac - had in - hospital code. He is now pain free, has a normal cardiac exam  3. Pulmonary status - gets SOB with exertion, not SOB at rest Plan Continue present medical reigmen  IS; ambulate.  PT on-going support in hospital  4. GU - he has been able to void w/o disfficulty.  Plan continue inhalaltional treatments  5. Dispo - will reordr Nurse visit, order PT eval, will need wound care consult

## 2012-07-03 NOTE — Assessment & Plan Note (Signed)
BP Readings from Last 3 Encounters:  07/03/12 110/70  06/25/12 151/65  03/26/12 159/68   He is stable at today's exam.  Plan- continue present medications

## 2012-07-03 NOTE — Telephone Encounter (Signed)
Spoke with [name], informed of home visit later today, they will be at home. Directions given to Dr. Debby Bud

## 2012-07-03 NOTE — Assessment & Plan Note (Signed)
Stable vision left eye/

## 2012-07-04 ENCOUNTER — Other Ambulatory Visit: Payer: Self-pay | Admitting: Internal Medicine

## 2012-07-04 DIAGNOSIS — L89159 Pressure ulcer of sacral region, unspecified stage: Secondary | ICD-10-CM

## 2012-07-04 DIAGNOSIS — R410 Disorientation, unspecified: Secondary | ICD-10-CM

## 2012-07-04 DIAGNOSIS — D509 Iron deficiency anemia, unspecified: Secondary | ICD-10-CM

## 2012-07-04 DIAGNOSIS — H547 Unspecified visual loss: Secondary | ICD-10-CM

## 2012-07-04 DIAGNOSIS — R531 Weakness: Secondary | ICD-10-CM

## 2012-07-04 DIAGNOSIS — H5440 Blindness, one eye, unspecified eye: Secondary | ICD-10-CM

## 2012-07-06 ENCOUNTER — Telehealth: Payer: Self-pay

## 2012-07-06 DIAGNOSIS — N390 Urinary tract infection, site not specified: Secondary | ICD-10-CM

## 2012-07-06 DIAGNOSIS — F0390 Unspecified dementia without behavioral disturbance: Secondary | ICD-10-CM

## 2012-07-06 DIAGNOSIS — N401 Enlarged prostate with lower urinary tract symptoms: Secondary | ICD-10-CM

## 2012-07-06 DIAGNOSIS — R339 Retention of urine, unspecified: Secondary | ICD-10-CM

## 2012-07-06 NOTE — Telephone Encounter (Signed)
Ok for rolling walker 

## 2012-07-06 NOTE — Telephone Encounter (Signed)
Phone call from [facility] with [facility] [phone] ext [phone] needing a verbal order for walker with wheels. Please advise.

## 2012-07-06 NOTE — Telephone Encounter (Signed)
Spoke to Anderson at [employer] care giving verbal order for walker with wheels.

## 2012-07-13 ENCOUNTER — Telehealth: Payer: Self-pay

## 2012-07-13 NOTE — Telephone Encounter (Signed)
Ok to continue services for home exercise

## 2012-07-13 NOTE — Telephone Encounter (Signed)
PT called requesting verbal order to continue services for home/theraputic exercises, balance and gait

## 2012-07-14 ENCOUNTER — Telehealth: Payer: Self-pay | Admitting: *Deleted

## 2012-07-14 NOTE — Telephone Encounter (Signed)
Left msg on triage stating pt is not eating at all per daughter. Requesting md to rx megace to help increase appetite...Raechel Chute

## 2012-07-14 NOTE — Telephone Encounter (Signed)
I do not feel comfortable with this, as it appears that pt has not been seen by [facility] since mar 2008

## 2012-07-14 NOTE — Telephone Encounter (Signed)
PT informed of verbal order via VM and to callback office with any questions/concerns.

## 2012-07-15 DIAGNOSIS — R5381 Other malaise: Secondary | ICD-10-CM

## 2012-07-15 DIAGNOSIS — R5383 Other fatigue: Secondary | ICD-10-CM

## 2012-07-15 DIAGNOSIS — R269 Unspecified abnormalities of gait and mobility: Secondary | ICD-10-CM

## 2012-07-15 NOTE — Telephone Encounter (Signed)
LM with Debbie(nurse with [facility]) that pt has not been seen since 2008 at Primary care and Dr Jonny Ruiz (who was taking call for Dr Debby Bud yesterday) did not feel comfortable prescribing this medication and to call us back with any questions or concerns.

## 2012-07-20 ENCOUNTER — Telehealth: Payer: Self-pay

## 2012-07-20 NOTE — Telephone Encounter (Signed)
Phone call back to [facility] nurse at [phone] letting her know the Dr Yetta Barre who is covering for Dr Debby Bud today did get the msg about pt's appetite and he is not aware of anything to help with this. I let her know if there are other concerns to give [place] a call.

## 2012-07-20 NOTE — Telephone Encounter (Signed)
[facility] nurse called stating pt is not eating well. He has no breakdown but a couple of bony prominence. What to do to help his appetite? Please advise.

## 2012-07-20 NOTE — Telephone Encounter (Signed)
I am not aware of anything to boost the appetite

## 2013-01-14 ENCOUNTER — Telehealth: Payer: Self-pay | Admitting: *Deleted

## 2013-01-14 DIAGNOSIS — L89300 Pressure ulcer of unspecified buttock, unstageable: Secondary | ICD-10-CM

## 2013-01-14 NOTE — Telephone Encounter (Signed)
[name], pts daughter called states pt is suffering from a bed sore, requesting next steps to take.  Please advise

## 2013-01-14 NOTE — Telephone Encounter (Signed)
Needs evaluation: if stable OV Monday, if not stable - fever, infected drainage, uncontrolled pain - go to ED

## 2013-01-18 NOTE — Telephone Encounter (Signed)
Referral to [facility]

## 2013-01-18 NOTE — Telephone Encounter (Signed)
[name] states [facility] has been used in the past.

## 2013-01-20 ENCOUNTER — Telehealth: Payer: Self-pay

## 2013-01-20 NOTE — Telephone Encounter (Signed)
Phone call from [place], nurse with [facility] [phone] states patient has been admitted today for home care nursing. He has 2 stage 2 wounds one on right buttocks and the other on coccyx both superficial. 0.3 cm Would like an order for Child psychotherapist and Child psychotherapist can work on transportation to the office. Also order for social worker, need to get gel pad for wheel chair and APP mattress. Order for PT and daughter is okay with this since he has had some falls and weak. Vitals okay today no fever. Only takes Mylanta 2 times a day. Would like an order for duoderm BID and prn for loosening.

## 2013-01-21 NOTE — Telephone Encounter (Signed)
Gave okay for order with a different nurse since Lyla Son was unavailable .

## 2013-01-21 NOTE — Telephone Encounter (Signed)
All orders requested are OK.

## 2013-01-22 ENCOUNTER — Telehealth: Payer: Self-pay | Admitting: Internal Medicine

## 2013-01-22 DIAGNOSIS — R531 Weakness: Secondary | ICD-10-CM

## 2013-01-22 DIAGNOSIS — R410 Disorientation, unspecified: Secondary | ICD-10-CM

## 2013-01-22 NOTE — Telephone Encounter (Signed)
01/22/2013  [facility] therapist, Frances Furbish, left message requesting to speak with RN or CMA to get orders for pt.  Please contact @ [phone]

## 2013-01-25 NOTE — Telephone Encounter (Signed)
Debroah Loop notified okay for visits as requested and order for bed rail has been faxed to [phone]

## 2013-01-25 NOTE — Telephone Encounter (Signed)
OK for visits as requested.  DME order done.

## 2013-01-25 NOTE — Telephone Encounter (Signed)
Phone call to [facility] requesting verbal order to see patient 1 week 1, 2 week 3, and 1 week 1. Also requesting an order for half length bed rail be faxed to [phone]([id number])

## 2013-01-28 ENCOUNTER — Telehealth: Payer: Self-pay | Admitting: *Deleted

## 2013-01-28 DIAGNOSIS — L8993 Pressure ulcer of unspecified site, stage 3: Secondary | ICD-10-CM

## 2013-01-28 DIAGNOSIS — H547 Unspecified visual loss: Secondary | ICD-10-CM

## 2013-01-28 DIAGNOSIS — H5440 Blindness, one eye, unspecified eye: Secondary | ICD-10-CM

## 2013-01-28 DIAGNOSIS — R410 Disorientation, unspecified: Secondary | ICD-10-CM

## 2013-01-28 DIAGNOSIS — R269 Unspecified abnormalities of gait and mobility: Secondary | ICD-10-CM

## 2013-01-28 DIAGNOSIS — L8992 Pressure ulcer of unspecified site, stage 2: Secondary | ICD-10-CM

## 2013-01-28 DIAGNOSIS — R531 Weakness: Secondary | ICD-10-CM

## 2013-01-28 DIAGNOSIS — M6281 Muscle weakness (generalized): Secondary | ICD-10-CM

## 2013-01-28 DIAGNOSIS — L89309 Pressure ulcer of unspecified buttock, unspecified stage: Secondary | ICD-10-CM

## 2013-01-28 NOTE — Telephone Encounter (Signed)
Spoke with Debroah Loop advised Rx sent

## 2013-01-28 NOTE — Telephone Encounter (Signed)
DME prescriptions dones

## 2013-01-28 NOTE — Telephone Encounter (Signed)
Arnold from [facility] called requesting wheel chair and wheel chair cushion due to pt having pressure ulcer on buttocks.  Please fax order to [phone]

## 2013-02-23 ENCOUNTER — Telehealth: Payer: Self-pay

## 2013-02-23 NOTE — Telephone Encounter (Signed)
Phone call from [employer], home health nurse with Frances Furbish [phone] states she is putting in for a social work referral to assess his home environment.

## 2013-02-23 NOTE — Telephone Encounter (Signed)
Good idea. I am concerned about a "brothel" type environment

## 2013-02-24 NOTE — Telephone Encounter (Signed)
Phone call to [place] letting her know Dr Debby Bud thinks it's a good idea for social work referral.

## 2013-03-29 ENCOUNTER — Telehealth: Payer: Self-pay | Admitting: Internal Medicine

## 2013-03-29 DIAGNOSIS — R531 Weakness: Secondary | ICD-10-CM

## 2013-03-29 DIAGNOSIS — H547 Unspecified visual loss: Secondary | ICD-10-CM

## 2013-03-29 DIAGNOSIS — H5440 Blindness, one eye, unspecified eye: Secondary | ICD-10-CM

## 2013-03-29 DIAGNOSIS — D509 Iron deficiency anemia, unspecified: Secondary | ICD-10-CM

## 2013-03-29 NOTE — Telephone Encounter (Signed)
Pt's daughter called request referral to be [facility]. Pt's daughter also stated that Mr. Tuel also request an air mattress due to pt is incontinence but they said the doctor has to order that. Please advise. Pt was wondering if they can get this done today.

## 2013-03-30 NOTE — Telephone Encounter (Signed)
Order in to [facility] 

## 2013-03-30 NOTE — Telephone Encounter (Signed)
Patient's daughter Stephannie Li(Donna Mcray) called again stating she has not heard back from message from yesterday. (see below message) please advise, thanks

## 2013-03-31 ENCOUNTER — Telehealth: Payer: Self-pay | Admitting: Internal Medicine

## 2013-03-31 NOTE — Telephone Encounter (Signed)
A dilemma: is there another agency that is covered by his insurance? If not he will either need an office visit or if he has a decubitus ulcer and other problems he can be transported by non-emergent ambulance to ED.

## 2013-03-31 NOTE — Telephone Encounter (Signed)
Dr Debby Budnorins I [facility] over [facility] but Efraim Kaufmannmelissa states hat she cant not asses pt due  To insurance , [facility] stated that they discharged the pt from their services due to him not able to go to the Dr's office and for other reason and she will fax over the information to Dr Debby BudNorins I called the pt daughter to inform of this because she states pt need home health but no one will see pt please advise

## 2013-04-02 NOTE — Telephone Encounter (Signed)
Patient needs to be seen: either OV or have transported to ED

## 2013-04-02 NOTE — Telephone Encounter (Signed)
Returned call to phone number provided, rang x 5, no answer

## 2013-04-02 NOTE — Telephone Encounter (Signed)
Patient's dtr has called back again, requesting an order for a decubitus ulcer cream/abx.  Please advise.   CB# [phone]

## 2013-04-05 ENCOUNTER — Telehealth: Payer: Self-pay

## 2013-04-05 NOTE — Telephone Encounter (Signed)
Spoke with pt's daughter [name](Donna) - she states she is hoping to get an ointment for the patient's pressure sores.  She is hoping to avoid having to call an ambulance.

## 2013-04-05 NOTE — Telephone Encounter (Signed)
The patient's daughter called and is hoping to speak with Dr.Norins, she did not say anything specific.   Callback - [phone]

## 2013-04-05 NOTE — Telephone Encounter (Signed)
CANNOT PRESCRIBE TREATMENT for a wound not seen and evaluated. Home Health declined. Could not find a HH agency that is covered. Options are office visit or ED visit.

## 2013-04-05 NOTE — Telephone Encounter (Signed)
Several messages back and forth. Last message was - cannot prescribe w/o evaluation. If unable to come to the office than will need to call ambulance to take to hospital.   Is there another Issue? - need to know before I call, at the earliest [facility]6PM

## 2013-04-05 NOTE — Telephone Encounter (Signed)
Phone call back and spoke to patient's daughter letting her know of MD response and that we did try calling on Friday but no answer/no voicemail. She states she will get back to us.

## 2013-04-07 NOTE — Telephone Encounter (Signed)
Spoke with daughter, Lupita LeashDonna. She stated he is now experiencing swelling in his feet.  I offered her an apt this afternoon, however, she stated she needed 24 hours notice to have the transport service take the patient.  The patient is scheduled for tomorrow afternoon.

## 2013-04-08 ENCOUNTER — Encounter: Payer: Self-pay | Admitting: Internal Medicine

## 2013-04-08 ENCOUNTER — Other Ambulatory Visit (INDEPENDENT_AMBULATORY_CARE_PROVIDER_SITE_OTHER): Payer: Medicare Other

## 2013-04-08 ENCOUNTER — Ambulatory Visit (INDEPENDENT_AMBULATORY_CARE_PROVIDER_SITE_OTHER): Payer: Medicare Other | Admitting: Internal Medicine

## 2013-04-08 VITALS — BP 130/80 | Temp 97.5°F

## 2013-04-08 DIAGNOSIS — I10 Essential (primary) hypertension: Secondary | ICD-10-CM

## 2013-04-08 DIAGNOSIS — R5381 Other malaise: Secondary | ICD-10-CM

## 2013-04-08 DIAGNOSIS — R5383 Other fatigue: Secondary | ICD-10-CM

## 2013-04-08 DIAGNOSIS — D509 Iron deficiency anemia, unspecified: Secondary | ICD-10-CM

## 2013-04-08 DIAGNOSIS — E876 Hypokalemia: Secondary | ICD-10-CM

## 2013-04-08 DIAGNOSIS — R0602 Shortness of breath: Secondary | ICD-10-CM

## 2013-04-08 DIAGNOSIS — R531 Weakness: Secondary | ICD-10-CM

## 2013-04-08 DIAGNOSIS — L8992 Pressure ulcer of unspecified site, stage 2: Secondary | ICD-10-CM

## 2013-04-08 DIAGNOSIS — L89209 Pressure ulcer of unspecified hip, unspecified stage: Secondary | ICD-10-CM

## 2013-04-08 DIAGNOSIS — L89202 Pressure ulcer of unspecified hip, stage 2: Secondary | ICD-10-CM

## 2013-04-08 DIAGNOSIS — N39490 Overflow incontinence: Secondary | ICD-10-CM

## 2013-04-08 NOTE — Progress Notes (Signed)
Pre visit review using our clinic review tool, if applicable. No additional management support is needed unless otherwise documented below in the visit note. 

## 2013-04-09 LAB — CBC WITH DIFFERENTIAL/PLATELET
BASOS ABS: 0 10*3/uL (ref 0.0–0.1)
Basophils Relative: 0.4 % (ref 0.0–3.0)
Eosinophils Absolute: 0 10*3/uL (ref 0.0–0.7)
Eosinophils Relative: 0.7 % (ref 0.0–5.0)
HEMATOCRIT: 32.1 % — AB (ref 39.0–52.0)
HEMOGLOBIN: 10.5 g/dL — AB (ref 13.0–17.0)
LYMPHS ABS: 2.3 10*3/uL (ref 0.7–4.0)
Lymphocytes Relative: 36 % (ref 12.0–46.0)
MCHC: 32.7 g/dL (ref 30.0–36.0)
MCV: 89.7 fl (ref 78.0–100.0)
MONO ABS: 0.4 10*3/uL (ref 0.1–1.0)
MONOS PCT: 5.5 % (ref 3.0–12.0)
NEUTROS ABS: 3.7 10*3/uL (ref 1.4–7.7)
Neutrophils Relative %: 57.4 % (ref 43.0–77.0)
Platelets: 150 10*3/uL (ref 150.0–400.0)
RBC: 3.58 Mil/uL — ABNORMAL LOW (ref 4.22–5.81)
RDW: 16.8 % — AB (ref 11.5–14.6)
WBC: 6.5 10*3/uL (ref 4.5–10.5)

## 2013-04-09 LAB — BASIC METABOLIC PANEL
BUN: 18 mg/dL (ref 6–23)
CALCIUM: 8.3 mg/dL — AB (ref 8.4–10.5)
CO2: 26 meq/L (ref 19–32)
CREATININE: 1.3 mg/dL (ref 0.4–1.5)
Chloride: 114 mEq/L — ABNORMAL HIGH (ref 96–112)
GFR: 68.05 mL/min (ref >60.00)
GLUCOSE: 103 mg/dL — AB (ref 70–99)
Potassium: 4.6 mEq/L (ref 3.5–5.1)
Sodium: 145 mEq/L (ref 135–145)

## 2013-04-09 LAB — BRAIN NATRIURETIC PEPTIDE: Pro B Natriuretic peptide (BNP): 145 pg/mL — ABNORMAL HIGH (ref 0.0–100.0)

## 2013-04-11 ENCOUNTER — Encounter: Payer: Self-pay | Admitting: Internal Medicine

## 2013-04-11 DIAGNOSIS — L89202 Pressure ulcer of unspecified hip, stage 2: Secondary | ICD-10-CM | POA: Insufficient documentation

## 2013-04-11 DIAGNOSIS — N39490 Overflow incontinence: Secondary | ICD-10-CM | POA: Insufficient documentation

## 2013-04-11 MED ORDER — TAMSULOSIN HCL 0.4 MG PO CAPS: 0.4000 mg | ORAL_CAPSULE | Freq: Every day | ORAL | Status: DC

## 2013-04-11 MED ORDER — FINASTERIDE 5 MG PO TABS: 5.0000 mg | ORAL_TABLET | Freq: Every day | ORAL | Status: DC

## 2013-04-11 NOTE — Assessment & Plan Note (Signed)
Very deconditioned. Does not appear to get much activity. Needs full assistance for ADLs. Lives in small environment.  Plan Reestablish with HH: RN to assess wounds and vitals, PT/OT for strengthening and ADLs assessement and treatment; social work assessment for safe environment

## 2013-04-11 NOTE — Assessment & Plan Note (Signed)
Lab Results  Component Value Date   HGB 10.5* 04/08/2013   Chronic anemia: iron deficient + chronic disease. Stable  Plan Iron rich diet and/or otc iron supplement

## 2013-04-11 NOTE — Assessment & Plan Note (Signed)
Right hip/trochanter - stage 1-2; right buttock stage 1. No sign of infection, no drainage.  Plan Applied Tegaderm dressing as temporary dressing  Home Health for wound care - will do better with duoderm - thicker, more padding  Needs pressure reduction overlay mattress.

## 2013-04-11 NOTE — Progress Notes (Signed)
Subjective:    Patient ID: [name], male    DOB: 01/30/1923, 80 y.o.   MRN: [id number]  HPI [name] presents for evaluation of decubitus ulcer(s). For many months he has been living at home. Previous home visit revealed that he is basically confined to a small bedroom. At the time of home visit he was using a trash can for a BSC and was ill-kempt. Home health was involved in the patients care and supposedly was delivered a BSC. He has had a h/o skin breakdown and a his daughter reports that she and helpers have been using neosporin and bandages. She called asking for additional treatment. Home health stopped seeing the patient and new arrangements have not yet been made. Daughter was advised that no treatment could be prescribed w/o seeing the patient.  In the office he is w/c bound. He is awake and does not appear to be in distress. He is neat and does not eminate any odor to suggest infection.   Past Medical History  Diagnosis Date  . Unspecified visual loss   . Blindness of right eye   . Anemia, iron deficiency   . Glaucoma   . Hearing decreased    Past Surgical History  Procedure Laterality Date  . Appendectomy  Age 35  . Cataract extraction      Left 1989, Right 1990   Family History  Problem Relation Age of Onset  . Glaucoma Daughter   . Diabetes Father   . Cancer - Other Father     Bone  . Cancer - Other Brother     Liver   History   Social History  . Marital Status: Widowed    Spouse Name: N/A    Number of Children: 3  . Years of Education: N/A   Occupational History  . Retired Visual merchandiser    Social History Main Topics  . Smoking status: Never Smoker   . Smokeless tobacco: Never Used  . Alcohol Use: No  . Drug Use: No  . Sexual Activity:    Other Topics Concern  . 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   His daughters live with him along with their children in a large home he had built: 5 bedrooms, 3 1/2 baths.      END OF LIFE CARE: No CPR, No prolonged ventilatory support; no heroic or futile measures.          Current Outpatient Prescriptions on File Prior to Visit  Medication Sig Dispense Refill  . feeding supplement (ENSURE COMPLETE) LIQD Take 237 mLs by mouth 2 (two) times daily between meals.  60 Bottle  5  . latanoprost (XALATAN) 0.005 % ophthalmic solution Place 1 drop into the left eye at bedtime.       Marland Kitchen levobunolol (BETAGAN) 0.5 % ophthalmic solution Place 1 drop into the left eye daily.       No current facility-administered medications on file prior to visit.    Current Outpatient Prescriptions on File Prior to Visit  Medication Sig Dispense Refill  . feeding supplement (ENSURE COMPLETE) LIQD Take 237 mLs by mouth 2 (two) times daily between meals.  60 Bottle  5  . latanoprost (XALATAN) 0.005 % ophthalmic solution Place 1 drop into the left eye at bedtime.       Marland Kitchen. levobunolol (BETAGAN) 0.5 % ophthalmic solution Place 1 drop into the left eye daily.       No current facility-administered medications on file prior to visit.     Review of Systems Patient unable to give a report on ROS. Per daughter he is constantly incontinent. She had stopped all his medications including proscar and flomax.     Objective:   Physical Exam Filed Vitals:   04/08/13 1636  BP: 130/80  Temp: 97.5 F (36.4 C)   Wt Readings from Last 3 Encounters:  06/17/12 136 lb (61.689 kg)  03/23/12 135 lb 2.3 oz (61.3 kg)  04/27/10 155 lb (70.308 kg)   No weight obtained.  Gen'l- elderly, thin man in no acute distress. Cannot ambulate. Required 3+ assist with lifting to move from w/c to exam table. He was very fearful of falling.  HEENT - C&S muddy,  oropharynx not examined Cor 2+ radial, RRR Pulm - normal respirations Neuro - awake, speech is clear, insight and understanding of his condition is limited. MAE but no to command. MS - can stand with 2+ assist, poor balance. Derm - 2x2 cm stage 1 decubitis at the right ischial tuberosity - no drainage, odor. 4x4 cm decubitus ulcer right trochanter region with redness but no drainage, no fluctuance and with a q-tip probe no hidden depth.         Assessment & Plan:

## 2013-04-11 NOTE — Assessment & Plan Note (Signed)
BP Readings from Last 3 Encounters:  04/08/13 130/80  07/03/12 110/70  06/25/12 151/65   OK BP reading off medication. May be related to loss of body weight.  Plan No medication at this time.

## 2013-04-11 NOTE — Assessment & Plan Note (Signed)
BMET    Component Value Date/Time   NA 145 04/08/2013 1724   K 4.6 04/08/2013 1724   CL 114* 04/08/2013 1724   CO2 26 04/08/2013 1724   GLUCOSE 103* 04/08/2013 1724   BUN 18 04/08/2013 1724   CREATININE 1.3 04/08/2013 1724   CALCIUM 8.3* 04/08/2013 1724   GFRNONAA 65* 06/24/2012 0440   GFRAA 75* 06/24/2012 0440    Normal sodium and potassium. No treatment needed.

## 2013-04-12 ENCOUNTER — Telehealth: Payer: Self-pay

## 2013-04-12 MED ORDER — TAMSULOSIN HCL 0.4 MG PO CAPS: 0.4000 mg | ORAL_CAPSULE | Freq: Every day | ORAL | Status: AC

## 2013-04-12 MED ORDER — FINASTERIDE 5 MG PO TABS: 5.0000 mg | ORAL_TABLET | Freq: Every day | ORAL | Status: AC

## 2013-04-12 NOTE — Telephone Encounter (Signed)
Message copied by Noreene LarssonANDREWS, Khole Arterburn R on Mon Apr 12, 2013  9:05 AM ------      Message from: Jacques NavyNORINS, MICHAEL E      Created: Sun Apr 11, 2013 10:31 AM       Please call daughter:      1. [facility] contacted for RN -wound care, PT and OT and social work to assess any needs they can meet.      2. Restart Proscar and Flomax - his incontinence may be in part due to overflow due to BPH and poor emptying of bladder.             Thanks ------

## 2013-04-12 NOTE — Telephone Encounter (Signed)
Left message for Lupita LeashDonna (daughter) to return call

## 2013-04-12 NOTE — Telephone Encounter (Signed)
[name] has been notified and states refills of the Proscar and Flomax need to be sent to [facility] on [address]

## 2013-05-10 ENCOUNTER — Inpatient Hospital Stay (HOSPITAL_COMMUNITY)
Admission: EM | Admit: 2013-05-10 | Discharge: 2013-05-23 | DRG: 592 | Disposition: E | Payer: Medicare Other | Attending: Internal Medicine | Admitting: Internal Medicine

## 2013-05-10 ENCOUNTER — Emergency Department (HOSPITAL_COMMUNITY): Payer: Medicare Other

## 2013-05-10 ENCOUNTER — Encounter (HOSPITAL_COMMUNITY): Payer: Self-pay | Admitting: Emergency Medicine

## 2013-05-10 DIAGNOSIS — R339 Retention of urine, unspecified: Secondary | ICD-10-CM | POA: Diagnosis not present

## 2013-05-10 DIAGNOSIS — L89202 Pressure ulcer of unspecified hip, stage 2: Secondary | ICD-10-CM | POA: Diagnosis present

## 2013-05-10 DIAGNOSIS — R531 Weakness: Secondary | ICD-10-CM

## 2013-05-10 DIAGNOSIS — N289 Disorder of kidney and ureter, unspecified: Secondary | ICD-10-CM | POA: Diagnosis present

## 2013-05-10 DIAGNOSIS — Z79899 Other long term (current) drug therapy: Secondary | ICD-10-CM

## 2013-05-10 DIAGNOSIS — D509 Iron deficiency anemia, unspecified: Secondary | ICD-10-CM | POA: Diagnosis present

## 2013-05-10 DIAGNOSIS — R634 Abnormal weight loss: Secondary | ICD-10-CM | POA: Diagnosis present

## 2013-05-10 DIAGNOSIS — H5440 Blindness, one eye, unspecified eye: Secondary | ICD-10-CM | POA: Diagnosis present

## 2013-05-10 DIAGNOSIS — L89109 Pressure ulcer of unspecified part of back, unspecified stage: Principal | ICD-10-CM | POA: Diagnosis present

## 2013-05-10 DIAGNOSIS — N138 Other obstructive and reflux uropathy: Secondary | ICD-10-CM | POA: Diagnosis present

## 2013-05-10 DIAGNOSIS — F0390 Unspecified dementia without behavioral disturbance: Secondary | ICD-10-CM | POA: Diagnosis present

## 2013-05-10 DIAGNOSIS — H409 Unspecified glaucoma: Secondary | ICD-10-CM | POA: Diagnosis present

## 2013-05-10 DIAGNOSIS — Z8674 Personal history of sudden cardiac arrest: Secondary | ICD-10-CM

## 2013-05-10 DIAGNOSIS — Z681 Body mass index (BMI) 19 or less, adult: Secondary | ICD-10-CM

## 2013-05-10 DIAGNOSIS — L89209 Pressure ulcer of unspecified hip, unspecified stage: Secondary | ICD-10-CM | POA: Diagnosis present

## 2013-05-10 DIAGNOSIS — Z7401 Bed confinement status: Secondary | ICD-10-CM

## 2013-05-10 DIAGNOSIS — L089 Local infection of the skin and subcutaneous tissue, unspecified: Secondary | ICD-10-CM | POA: Diagnosis present

## 2013-05-10 DIAGNOSIS — R413 Other amnesia: Secondary | ICD-10-CM | POA: Diagnosis present

## 2013-05-10 DIAGNOSIS — E43 Unspecified severe protein-calorie malnutrition: Secondary | ICD-10-CM | POA: Diagnosis present

## 2013-05-10 DIAGNOSIS — L8992 Pressure ulcer of unspecified site, stage 2: Secondary | ICD-10-CM | POA: Diagnosis present

## 2013-05-10 DIAGNOSIS — R159 Full incontinence of feces: Secondary | ICD-10-CM | POA: Diagnosis present

## 2013-05-10 DIAGNOSIS — E860 Dehydration: Secondary | ICD-10-CM | POA: Diagnosis present

## 2013-05-10 DIAGNOSIS — N179 Acute kidney failure, unspecified: Secondary | ICD-10-CM

## 2013-05-10 DIAGNOSIS — Z9849 Cataract extraction status, unspecified eye: Secondary | ICD-10-CM

## 2013-05-10 DIAGNOSIS — M129 Arthropathy, unspecified: Secondary | ICD-10-CM | POA: Diagnosis present

## 2013-05-10 DIAGNOSIS — Z66 Do not resuscitate: Secondary | ICD-10-CM | POA: Diagnosis present

## 2013-05-10 DIAGNOSIS — L8993 Pressure ulcer of unspecified site, stage 3: Secondary | ICD-10-CM | POA: Diagnosis present

## 2013-05-10 DIAGNOSIS — L8990 Pressure ulcer of unspecified site, unspecified stage: Secondary | ICD-10-CM

## 2013-05-10 DIAGNOSIS — N401 Enlarged prostate with lower urinary tract symptoms: Secondary | ICD-10-CM | POA: Diagnosis present

## 2013-05-10 DIAGNOSIS — L89153 Pressure ulcer of sacral region, stage 3: Secondary | ICD-10-CM | POA: Diagnosis present

## 2013-05-10 LAB — URINALYSIS, ROUTINE W REFLEX MICROSCOPIC
BILIRUBIN URINE: NEGATIVE
Glucose, UA: NEGATIVE mg/dL
HGB URINE DIPSTICK: NEGATIVE
Ketones, ur: NEGATIVE mg/dL
Leukocytes, UA: NEGATIVE
NITRITE: NEGATIVE
PH: 5.5 (ref 5.0–8.0)
Protein, ur: NEGATIVE mg/dL
SPECIFIC GRAVITY, URINE: 1.014 (ref 1.005–1.030)
Urobilinogen, UA: 0.2 mg/dL (ref 0.0–1.0)

## 2013-05-10 LAB — COMPREHENSIVE METABOLIC PANEL
ALBUMIN: 1.8 g/dL — AB (ref 3.5–5.2)
AST: 17 U/L (ref 0–37)
Alkaline Phosphatase: 53 U/L (ref 39–117)
BUN: 24 mg/dL — ABNORMAL HIGH (ref 6–23)
CALCIUM: 8.5 mg/dL (ref 8.4–10.5)
CO2: 23 mEq/L (ref 19–32)
Chloride: 108 mEq/L (ref 96–112)
Creatinine, Ser: 1.44 mg/dL — ABNORMAL HIGH (ref 0.50–1.35)
GFR calc non Af Amer: 42 mL/min — ABNORMAL LOW (ref >90)
GFR, EST AFRICAN AMERICAN: 48 mL/min — AB (ref >90)
Glucose, Bld: 73 mg/dL (ref 70–99)
Potassium: 3.5 mEq/L — ABNORMAL LOW (ref 3.7–5.3)
SODIUM: 145 meq/L (ref 137–147)
TOTAL PROTEIN: 5.2 g/dL — AB (ref 6.0–8.3)
Total Bilirubin: 0.4 mg/dL (ref 0.3–1.2)

## 2013-05-10 LAB — CBC WITH DIFFERENTIAL/PLATELET
BASOS ABS: 0 10*3/uL (ref 0.0–0.1)
Basophils Relative: 0 % (ref 0–1)
EOS ABS: 0 10*3/uL (ref 0.0–0.7)
EOS PCT: 0 % (ref 0–5)
HCT: 28.3 % — ABNORMAL LOW (ref 39.0–52.0)
Hemoglobin: 9.7 g/dL — ABNORMAL LOW (ref 13.0–17.0)
Lymphocytes Relative: 15 % (ref 12–46)
Lymphs Abs: 1.3 10*3/uL (ref 0.7–4.0)
MCH: 29.1 pg (ref 26.0–34.0)
MCHC: 34.3 g/dL (ref 30.0–36.0)
MCV: 85 fL (ref 78.0–100.0)
Monocytes Absolute: 0.5 10*3/uL (ref 0.1–1.0)
Monocytes Relative: 6 % (ref 3–12)
Neutro Abs: 7.1 10*3/uL (ref 1.7–7.7)
Neutrophils Relative %: 80 % — ABNORMAL HIGH (ref 43–77)
PLATELETS: 167 10*3/uL (ref 150–400)
RBC: 3.33 MIL/uL — ABNORMAL LOW (ref 4.22–5.81)
RDW: 15.9 % — AB (ref 11.5–15.5)
WBC: 9 10*3/uL (ref 4.0–10.5)

## 2013-05-10 MED ORDER — MORPHINE SULFATE 2 MG/ML IJ SOLN
1.0000 mg | INTRAMUSCULAR | Status: DC | PRN
  Administered 2013-05-11: 1 mg via INTRAVENOUS
  Filled 2013-05-10: qty 1

## 2013-05-10 MED ORDER — SODIUM CHLORIDE 0.9 % IV SOLN
INTRAVENOUS | Status: DC
Start: 2013-05-10 — End: 2013-05-12
  Administered 2013-05-10 – 2013-05-11 (×2): via INTRAVENOUS

## 2013-05-10 MED ORDER — ENSURE COMPLETE PO LIQD
237.0000 mL | Freq: Three times a day (TID) | ORAL | Status: DC
  Administered 2013-05-10 – 2013-05-11 (×3): 237 mL via ORAL

## 2013-05-10 MED ORDER — PIPERACILLIN-TAZOBACTAM 3.375 G IVPB
3.3750 g | Freq: Three times a day (TID) | INTRAVENOUS | Status: DC
  Administered 2013-05-10 – 2013-05-11 (×3): 3.375 g via INTRAVENOUS
  Filled 2013-05-10 (×5): qty 50

## 2013-05-10 MED ORDER — LEVOBUNOLOL HCL 0.5 % OP SOLN
1.0000 [drp] | Freq: Every day | OPHTHALMIC | Status: DC
  Administered 2013-05-10 – 2013-05-11 (×2): 1 [drp] via OPHTHALMIC
  Filled 2013-05-10: qty 5

## 2013-05-10 MED ORDER — POTASSIUM CHLORIDE 10 MEQ/100ML IV SOLN
10.0000 meq | INTRAVENOUS | Status: AC
  Administered 2013-05-10 – 2013-05-11 (×4): 10 meq via INTRAVENOUS
  Filled 2013-05-10 (×4): qty 100

## 2013-05-10 MED ORDER — LATANOPROST 0.005 % OP SOLN
1.0000 [drp] | Freq: Every day | OPHTHALMIC | Status: DC
  Administered 2013-05-10: 1 [drp] via OPHTHALMIC
  Filled 2013-05-10: qty 2.5

## 2013-05-10 MED ORDER — DOCUSATE SODIUM 100 MG PO CAPS
100.0000 mg | ORAL_CAPSULE | Freq: Two times a day (BID) | ORAL | Status: DC
Start: 2013-05-10 — End: 2013-05-12
  Administered 2013-05-10 – 2013-05-11 (×2): 100 mg via ORAL
  Filled 2013-05-10 (×3): qty 1

## 2013-05-10 MED ORDER — MORPHINE SULFATE 4 MG/ML IJ SOLN
4.0000 mg | Freq: Once | INTRAMUSCULAR | Status: AC
  Administered 2013-05-10: 4 mg via INTRAVENOUS
  Filled 2013-05-10: qty 1

## 2013-05-10 MED ORDER — ONDANSETRON HCL 4 MG/2ML IJ SOLN: 4.0000 mg | Freq: Four times a day (QID) | INTRAMUSCULAR | Status: DC | PRN

## 2013-05-10 MED ORDER — FINASTERIDE 5 MG PO TABS
5.0000 mg | ORAL_TABLET | Freq: Every day | ORAL | Status: DC
  Administered 2013-05-11: 5 mg via ORAL
  Filled 2013-05-10: qty 1

## 2013-05-10 MED ORDER — VANCOMYCIN HCL IN DEXTROSE 750-5 MG/150ML-% IV SOLN
750.0000 mg | INTRAVENOUS | Status: DC
  Administered 2013-05-10: 750 mg via INTRAVENOUS
  Filled 2013-05-10 (×2): qty 150

## 2013-05-10 MED ORDER — ADULT MULTIVITAMIN W/MINERALS CH
1.0000 | ORAL_TABLET | Freq: Every day | ORAL | Status: DC
Start: 2013-05-10 — End: 2013-05-12
  Administered 2013-05-10 – 2013-05-11 (×2): 1 via ORAL
  Filled 2013-05-10 (×2): qty 1

## 2013-05-10 MED ORDER — ACETAMINOPHEN 650 MG RE SUPP: 650.0000 mg | Freq: Four times a day (QID) | RECTAL | Status: DC | PRN

## 2013-05-10 MED ORDER — ONDANSETRON HCL 4 MG PO TABS: 4.0000 mg | ORAL_TABLET | Freq: Four times a day (QID) | ORAL | Status: DC | PRN

## 2013-05-10 MED ORDER — TAMSULOSIN HCL 0.4 MG PO CAPS
0.4000 mg | ORAL_CAPSULE | Freq: Every day | ORAL | Status: DC
  Administered 2013-05-11: 0.4 mg via ORAL
  Filled 2013-05-10: qty 1

## 2013-05-10 MED ORDER — SODIUM CHLORIDE 0.9 % IV BOLUS (SEPSIS)
500.0000 mL | Freq: Once | INTRAVENOUS | Status: AC
  Administered 2013-05-10: 500 mL via INTRAVENOUS

## 2013-05-10 MED ORDER — ONDANSETRON HCL 4 MG/2ML IJ SOLN
4.0000 mg | Freq: Once | INTRAMUSCULAR | Status: AC
  Administered 2013-05-10: 4 mg via INTRAVENOUS
  Filled 2013-05-10: qty 2

## 2013-05-10 MED ORDER — OXYCODONE HCL 5 MG PO TABS
5.0000 mg | ORAL_TABLET | ORAL | Status: DC | PRN
  Administered 2013-05-11: 5 mg via ORAL
  Filled 2013-05-10: qty 1

## 2013-05-10 MED ORDER — ACETAMINOPHEN 325 MG PO TABS: 650.0000 mg | ORAL_TABLET | Freq: Four times a day (QID) | ORAL | Status: DC | PRN

## 2013-05-10 NOTE — Progress Notes (Signed)
ANTIBIOTIC CONSULT NOTE - INITIAL  Pharmacy Consult for Vancomycin/Zosyn Indication: wound infection  No Known Allergies  Patient Measurements: Height: 6' (182.9 cm) Weight: 132 lb 15 oz (60.3 kg) IBW/kg (Calculated) : 77.6  Vital Signs: Temp: 98.8 F (37.1 C) (02/16 2033) Temp src: Oral (02/16 1223) BP: 128/57 mmHg (02/16 2033) Pulse Rate: 97 (02/16 2033) Intake/Output from previous day:   Intake/Output from this shift:    Labs:  Recent Labs  2014-02-02 1250 2014-02-02 1340  WBC 9.0  --   HGB 9.7*  --   PLT 167  --   CREATININE  --  1.44*   Estimated Creatinine Clearance: 29.7 ml/min (by C-G formula based on Cr of 1.44). No results found for this basename: VANCOTROUGH, VANCOPEAK, VANCORANDOM, GENTTROUGH, GENTPEAK, GENTRANDOM, TOBRATROUGH, TOBRAPEAK, TOBRARND, AMIKACINPEAK, AMIKACINTROU, AMIKACIN,  in the last 72 hours   Microbiology: No results found for this or any previous visit (from the past 720 hour(s)).  Medical History: Past Medical History  Diagnosis Date  . Unspecified visual loss   . Blindness of right eye   . Anemia, iron deficiency   . Glaucoma   . Hearing decreased      Assessment: [age] yoM with dementia, bedbound, presents from home with pressure ulcers to right hip, buttock, sacral area.  Per family, ulcers getting worse.  Patient complains of pain with hip movement.  Starting empiric vancomycin and zosyn.  Afebrile  WBC 9.0  AKI - likely secondary to dehydration per MD assessment.  SCr 1.44, CrCl~35 ml/min/1.5073m2 (normalized), ~29 ml/min (CG)  Goal of Therapy:  Vancomycin trough level 15-20 mcg/ml Eradication of infection Doses adjusted per renal clearance  Plan:  1.  Vancomycin 750 mg IV q24h. 2.  Zosyn 3.375g IV q8h (4 hour infusion time). 3.  F/u SCr, trough levels, clinical course.  Clance Bollunyon, Deyanira Fesler 13-May-2013,9:22 PM

## 2013-05-10 NOTE — ED Provider Notes (Addendum)
CSN: [id number]     Arrival date & time 03/02/14  1211 History   First MD Initiated Contact with Patient 012/09/15 1249     Chief Complaint  Patient presents with  . Pressure Ulcer     (Consider location/radiation/quality/duration/timing/severity/associated sxs/prior Treatment) The history is provided by the patient and the EMS personnel. The history is limited by the condition of the patient.  pt with generalized weakness, deconditioning, dementia, bedbound, from home w pressure ulcers to right hip, buttock and sacral area. Constant. Pt v poor historian - level 5 caveat, hx dementia.  Pt unsure how long pressure sores present. Pain constant, dull, moderate, worse w turning. No fevers.       Past Medical History  Diagnosis Date  . Unspecified visual loss   . Blindness of right eye   . Anemia, iron deficiency   . Glaucoma   . Hearing decreased    Past Surgical History  Procedure Laterality Date  . Appendectomy  Age 59  . Cataract extraction      Left 1989, Right 1990   Family History  Problem Relation Age of Onset  . Glaucoma Daughter   . Diabetes Father   . Cancer - Other Father     Bone  . Cancer - Other Brother     Liver   History  Substance Use Topics  . Smoking status: Never Smoker   . Smokeless tobacco: Never Used  . Alcohol Use: No    Review of Systems  Unable to perform ROS: Dementia  Constitutional: Negative for fever.  level 5 caveat.      Allergies  Review of patient's allergies indicates no known allergies.  Home Medications   Current Outpatient Rx  Name  Route  Sig  Dispense  Refill  . feeding supplement (ENSURE COMPLETE) LIQD   Oral   Take 237 mLs by mouth 2 (two) times daily between meals.   60 Bottle   5   . finasteride (PROSCAR) 5 MG tablet   Oral   Take 1 tablet (5 mg total) by mouth daily.   30 tablet   11   . latanoprost (XALATAN) 0.005 % ophthalmic solution   Left Eye   Place 1 drop into the left eye at bedtime.           Marland Kitchen. levobunolol (BETAGAN) 0.5 % ophthalmic solution   Left Eye   Place 1 drop into the left eye daily.         . tamsulosin (FLOMAX) 0.4 MG CAPS capsule   Oral   Take 1 capsule (0.4 mg total) by mouth daily.   30 capsule   11    BP 136/54  Pulse 66  Temp(Src) 97.7 F (36.5 C) (Oral)  Resp 20  SpO2 16% Physical Exam  Nursing note and vitals reviewed. Constitutional: He appears well-developed and well-nourished. No distress.  HENT:  Head: Atraumatic.  Mouth/Throat: Oropharynx is clear and moist.  Eyes: Conjunctivae are normal. Pupils are equal, round, and reactive to light. No scleral icterus.  Neck: Normal range of motion. Neck supple. No tracheal deviation present.  Cardiovascular: Normal rate, regular rhythm, normal heart sounds and intact distal pulses.   Pulmonary/Chest: Effort normal and breath sounds normal. No accessory muscle usage. No respiratory distress. He exhibits no tenderness.  Abdominal: Soft. Bowel sounds are normal. He exhibits no distension. There is no tenderness.  Genitourinary:  No cva tenderness. Normal ext genitalia.   Musculoskeletal: Normal range of motion. He exhibits no edema and  no tenderness.  Neurological: He is alert.  Skin: Skin is warm and dry. He is not diaphoretic.  Pressure ulcers/sores to right hip, right buttock and sacral area. No bone exposed. No gross malodor. Scant yellowish drainage.  Psychiatric: He has a normal mood and affect.    ED Course  Procedures (including critical care time)  Results for orders placed during the hospital encounter of 2013/05/25  CBC WITH DIFFERENTIAL      Result Value Ref Range   WBC 9.0  4.0 - 10.5 K/uL   RBC 3.33 (*) 4.22 - 5.81 MIL/uL   Hemoglobin 9.7 (*) 13.0 - 17.0 g/dL   HCT 75.6 (*) 43.3 - 29.5 %   MCV 85.0  78.0 - 100.0 fL   MCH 29.1  26.0 - 34.0 pg   MCHC 34.3  30.0 - 36.0 g/dL   RDW 18.8 (*) 41.6 - 60.6 %   Platelets 167  150 - 400 K/uL   Neutrophils Relative % 80 (*) 43 - 77 %    Neutro Abs 7.1  1.7 - 7.7 K/uL   Lymphocytes Relative 15  12 - 46 %   Lymphs Abs 1.3  0.7 - 4.0 K/uL   Monocytes Relative 6  3 - 12 %   Monocytes Absolute 0.5  0.1 - 1.0 K/uL   Eosinophils Relative 0  0 - 5 %   Eosinophils Absolute 0.0  0.0 - 0.7 K/uL   Basophils Relative 0  0 - 1 %   Basophils Absolute 0.0  0.0 - 0.1 K/uL  COMPREHENSIVE METABOLIC PANEL      Result Value Ref Range   Sodium 145  137 - 147 mEq/L   Potassium 3.5 (*) 3.7 - 5.3 mEq/L   Chloride 108  96 - 112 mEq/L   CO2 23  19 - 32 mEq/L   Glucose, Bld 73  70 - 99 mg/dL   BUN 24 (*) 6 - 23 mg/dL   Creatinine, Ser 3.01 (*) 0.50 - 1.35 mg/dL   Calcium 8.5  8.4 - 60.1 mg/dL   Total Protein 5.2 (*) 6.0 - 8.3 g/dL   Albumin 1.8 (*) 3.5 - 5.2 g/dL   AST 17  0 - 37 U/L   ALT <5  0 - 53 U/L   Alkaline Phosphatase 53  39 - 117 U/L   Total Bilirubin 0.4  0.3 - 1.2 mg/dL   GFR calc non Af Amer 42 (*) >90 mL/min   GFR calc Af Amer 48 (*) >90 mL/min  URINALYSIS, ROUTINE W REFLEX MICROSCOPIC      Result Value Ref Range   Color, Urine YELLOW  YELLOW   APPearance CLOUDY (*) CLEAR   Specific Gravity, Urine 1.014  1.005 - 1.030   pH 5.5  5.0 - 8.0   Glucose, UA NEGATIVE  NEGATIVE mg/dL   Hgb urine dipstick NEGATIVE  NEGATIVE   Bilirubin Urine NEGATIVE  NEGATIVE   Ketones, ur NEGATIVE  NEGATIVE mg/dL   Protein, ur NEGATIVE  NEGATIVE mg/dL   Urobilinogen, UA 0.2  0.0 - 1.0 mg/dL   Nitrite NEGATIVE  NEGATIVE   Leukocytes, UA NEGATIVE  NEGATIVE       MDM  Iv ns. Labs.  Reviewed nursing notes and prior charts for additional history.   ?mildly acute kidney injury/dehydration by labs.  Iv ns bolus in ed.  Morphine iv for pain.  Family indicates unable to care for ulcers at home, progressively getting worse despite their efforts and home health care, and that  pcp had advised coming to ED/hospital for further care.  hospitalist service called re admission, hydration, pain control, wound care, and probable ecf placement.    Signed out to Dr Juleen China that [facility], Dr Barnie Del, coming to assess for admission.      Suzi Roots, MD 05/09/2013 [phone]

## 2013-05-10 NOTE — ED Notes (Signed)
Per EMS - pt from home (with VNA services) with c/o pressure ulcers and pain to buttocks. Awake, alert and appropriate to baseline per family, hx dementia.

## 2013-05-10 NOTE — ED Notes (Signed)
inpt MD at bs for eval.  

## 2013-05-10 NOTE — H&P (Signed)
[facility] History and Physical  [name] EHU:[id number] DOB: 05/16/1922 DOA: 05/14/2013   PCP: Adella Hare, MD  Specialists: None  Chief Complaint: Infected decubitus ulcer  HPI: [name] is a 80 y.o. male with a past medical history of glaucoma with the blindness in left eye. He also has a history of benign prostatic hypertrophy and, over the last year and a half has had dementia as well. Patient lives in his own house, but his daughter lives with him. She has other family members who help take care of the patient at home. In the last 3-4 months patient's mobility has declined significantly. Apparently, he has severe arthritis and he is unable to get up and do his any of his ADLs. He is not even able to get up into a wheelchair. Patient's daughter has been managing his decubitus ulcer with the help of home health. Patient has also had physical therapy on and off for the last couple years. She took him to see his primary care physician last month, and home health was again arranged. Home health nurse has been trying to take care of his wound. However, they feel that the wound has been getting worse over the last many days and recommended that he be brought into the hospital. There has been some bleeding from the wound. Some yellowish drainage. No history of any fever, nausea, vomiting. No diarrhea. He is incontinent of urine at times at times. He also has occasional stool incontinence. He was admitted back in March of 2014 with UTI and sepsis. When he was ready to be discharged he went into cardiac arrest. He was resuscitated. He did not require intubation. Did not require any vasopressor support. It was felt that her infection had contributed to the cardiac arrest. Subsequently patient was discharged home after he was stabilized. He was made a DO NOT RESUSCITATE. I did speak to his daughter, Butch Penny, who provided much of the history, although it was limited.  Home  Medications: Prior to Admission medications   Medication Sig Start Date End Date Taking? Authorizing Provider  feeding supplement (ENSURE COMPLETE) LIQD Take 237 mLs by mouth 2 (two) times daily between meals. 06/22/12   Neena Rhymes, MD  finasteride (PROSCAR) 5 MG tablet Take 1 tablet (5 mg total) by mouth daily. 04/12/13   Neena Rhymes, MD  latanoprost (XALATAN) 0.005 % ophthalmic solution Place 1 drop into the left eye at bedtime.     Historical Provider, MD  levobunolol (BETAGAN) 0.5 % ophthalmic solution Place 1 drop into the left eye daily.    Historical Provider, MD  tamsulosin (FLOMAX) 0.4 MG CAPS capsule Take 1 capsule (0.4 mg total) by mouth daily. 04/12/13   Neena Rhymes, MD    Allergies: No Known Allergies  Past Medical History: Past Medical History  Diagnosis Date  . Unspecified visual loss   . Blindness of right eye   . Anemia, iron deficiency   . Glaucoma   . Hearing decreased     Past Surgical History  Procedure Laterality Date  . Appendectomy  Age 59  . Cataract extraction      Left 1989, Right 1990    Social History: Patient lives in his house, but his daughter lives with him. Other family members help care for him. There is no history of smoking, alcohol, or illicit drug use. He is bedbound for the most part.  Family History:  Family History  Problem Relation Age of Onset  .  Glaucoma Daughter   . Diabetes Father   . Cancer - Other Father     Bone  . Cancer - Other Brother     Liver     Review of Systems - unable to obtain due to his dementia  Physical Examination  Filed Vitals:   04/25/2013 1223 05/09/2013 1431 05/04/2013 1530 05/01/2013 1610  BP: 136/54 112/69 156/64 138/76  Pulse: 66 62 62 81  Temp: 97.7 F (36.5 C)     TempSrc: Oral     Resp: 20 14 9 13   SpO2: 16% 99% 95% 92%    General appearance: alert, appears stated age, distracted and no distress Head: Normocephalic, without obvious abnormality, atraumatic Eyes:  conjunctivae/corneas clear. PERRL, EOM's intact.  Throat: dry mm without oral lesions Neck: no adenopathy, no carotid bruit, no JVD, supple, symmetrical, trachea midline and thyroid not enlarged, symmetric, no tenderness/mass/nodules Back: symmetric, no curvature. ROM normal. No CVA tenderness. Resp: clear to auscultation bilaterally Cardio: regular rate and rhythm, S1, S2 normal, no S3 or S4, systolic murmur: early systolic 2/6, crescendo at apex, no click and no rub GI: soft, non-tender; bowel sounds normal; no masses,  no organomegaly Extremities: There is some limitation of movement of the right hip, but it was predominantly due to the decubital wound of the right hip rather than due to joint problems.. no edema is present.  Pulses: 2+ and symmetric Skin: There is 1 decubitus, which measures about 5 cm around the right hip area posteriorly. Stage II to 3. Granulation tissue noted. No active drainage is present. There is a larger area in the sacrum with some necrotic tissue. Stage III. There is some foul smell, but no active drainage is noted at this time. Slight erythema around this area is noted. Lymph nodes: Cervical, supraclavicular, and axillary nodes normal. Neurologic: He is alert. He is blind. No cranial nerve deficits. Otherwise. Moving all his extremities without any focal deficits. Gait could not be assessed.  Laboratory Data: Results for orders placed during the hospital encounter of 05/06/2013 (from the past 48 hour(s))  CBC WITH DIFFERENTIAL     Status: Abnormal   Collection Time    05/13/2013 12:50 PM      Result Value Ref Harper   WBC 9.0  4.0 - 10.5 K/uL   RBC 3.33 (*) 4.22 - 5.81 MIL/uL   Hemoglobin 9.7 (*) 13.0 - 17.0 g/dL   HCT 28.3 (*) 39.0 - 52.0 %   MCV 85.0  78.0 - 100.0 fL   MCH 29.1  26.0 - 34.0 pg   MCHC 34.3  30.0 - 36.0 g/dL   RDW 15.9 (*) 11.5 - 15.5 %   Platelets 167  150 - 400 K/uL   Neutrophils Relative % 80 (*) 43 - 77 %   Neutro Abs 7.1  1.7 - 7.7 K/uL    Lymphocytes Relative 15  12 - 46 %   Lymphs Abs 1.3  0.7 - 4.0 K/uL   Monocytes Relative 6  3 - 12 %   Monocytes Absolute 0.5  0.1 - 1.0 K/uL   Eosinophils Relative 0  0 - 5 %   Eosinophils Absolute 0.0  0.0 - 0.7 K/uL   Basophils Relative 0  0 - 1 %   Basophils Absolute 0.0  0.0 - 0.1 K/uL  COMPREHENSIVE METABOLIC PANEL     Status: Abnormal   Collection Time    05/14/2013  1:40 PM      Result Value Ref Harper   Sodium  145  137 - 147 mEq/L   Potassium 3.5 (*) 3.7 - 5.3 mEq/L   Chloride 108  96 - 112 mEq/L   CO2 23  19 - 32 mEq/L   Glucose, Bld 73  70 - 99 mg/dL   BUN 24 (*) 6 - 23 mg/dL   Creatinine, Ser 1.44 (*) 0.50 - 1.35 mg/dL   Calcium 8.5  8.4 - 10.5 mg/dL   Total Protein 5.2 (*) 6.0 - 8.3 g/dL   Albumin 1.8 (*) 3.5 - 5.2 g/dL   AST 17  0 - 37 U/L   ALT <5  0 - 53 U/L   Alkaline Phosphatase 53  39 - 117 U/L   Total Bilirubin 0.4  0.3 - 1.2 mg/dL   GFR calc non Af Amer 42 (*) >90 mL/min   GFR calc Af Amer 48 (*) >90 mL/min   Comment: (NOTE)     The eGFR has been calculated using the CKD EPI equation.     This calculation has not been validated in all clinical situations.     eGFR's persistently <90 mL/min signify possible Chronic Kidney     Disease.  URINALYSIS, ROUTINE W REFLEX MICROSCOPIC     Status: Abnormal   Collection Time      1:51 PM      Result Value Ref Harper   Color, Urine YELLOW  YELLOW   APPearance CLOUDY (*) CLEAR   Specific Gravity, Urine 1.014  1.005 - 1.030   pH 5.5  5.0 - 8.0   Glucose, UA NEGATIVE  NEGATIVE mg/dL   Hgb urine dipstick NEGATIVE  NEGATIVE   Bilirubin Urine NEGATIVE  NEGATIVE   Ketones, ur NEGATIVE  NEGATIVE mg/dL   Protein, ur NEGATIVE  NEGATIVE mg/dL   Urobilinogen, UA 0.2  0.0 - 1.0 mg/dL   Nitrite NEGATIVE  NEGATIVE   Leukocytes, UA NEGATIVE  NEGATIVE   Comment: MICROSCOPIC NOT DONE ON URINES WITH NEGATIVE PROTEIN, BLOOD, LEUKOCYTES, NITRITE, OR GLUCOSE <1000 mg/dL.    Radiology Reports: No results found.  Problem  List  Principal Problem:   Sacral decubitus ulcer, stage III Active Problems:   ANEMIA, IRON DEFICIENCY   GLAUCOMA   BLINDNESS, RIGHT EYE   Decubitus ulcer of hip, stage 2   Severe protein-calorie malnutrition   Dehydration   Dementia   Assessment: This is an unfortunate 80 year old, African American male, who has been declining for the past year to year and a half. He has dementia, glaucoma. He has had decreased mobility over the last many months. As a result he has multiple decubitus ulcers. There is a sacral decube which appears to be infected. He is also is severely malnourished. He is dehydrated, with mild renal insufficiency.  Plan: #1 Infected sacral decubitus stage III: At this time he'll be admitted to the hospital and started on vancomycin and Zosyn. Wound care consultation will be placed. Depending on their input we may have to involve surgery if more extensive debridement is required. His abdomen is benign. His WBC is normal. Do not suspect infection spreading into visceral cavity. He does have a pain in his hips. So, we'll get a pelvic x-ray. Other, decubitus care will also be provided.  #2 mild renal insufficiency and dehydration: He'll be given IV fluids. Renal function will be monitored closely. Urine output will be monitored closely  #3 severe protein calorie malnutrition and weight loss: Patient appears to be slowly declining. Nutritional consult will be placed. He will be kept on ensure. PT and  OT consultation will be placed as well. Daughter understands that the patient is slowly declining.  #4 possible history of dementia: This has not been formally diagnosed but the patient exhibits signs and symptoms of same. Daughter also feels, that he's had significant memory impairment and cognitive impairment over last year.  #5 history of glaucoma: Continue with his eye drops.  #6 normocytic anemia: Hemoglobin appears to be close to baseline. Continue to monitor.  Considering  his progressive decline, patient could be a candidate for palliative care. We can reevaluate this over the next day or so.  DVT Prophylaxis: SCDs Code Status: CODE STATUS discussed in detail with the patient's daughter. Patient is a DO NOT RESUSCITATE. Family Communication:  Discussed with Butch Penny his daughter  Disposition Plan: Admit to [facility]. Daughter would like to take him back home when he is ready for discharge.  Further management decisions will depend on results of further testing and patient's response to treatment.  [facility]  [facility] Pager [phone]  If 7PM-7AM, please contact night-coverage [email or web address] Password [facility]  05/18/2013, 4:25 PM

## 2013-05-10 NOTE — ED Notes (Signed)
Pt assisted to reposition for comfort and to promote skin integrity.  [facility] and denies further needs/complaints at this time.  NAD.

## 2013-05-10 NOTE — ED Notes (Signed)
Initial Contact - pt resting on stretcher, c/o pain with movement.  Pt assisted to position of comfort and to promote skin integrity.  Pt with large area, approx 2.5", to L low back/buttocks, pressure area with dressing in place.  ST II, bright right red wound bed noted with slough to edges.  Serosanginous drng noted.  Otherwise skin PWD.  MAEI.  Per reports pt with hx dementia and at baseline currently.  Pt awake, alert, following directions and answering questions appropriately.  Changed to hospital gown, placed to cardiac/02 monitor, NAD.  Awaiting EDP eval.

## 2013-05-10 NOTE — ED Notes (Signed)
Bed: [id number] Expected date:  Expected time:  Means of arrival:  Comments: ems- elderly, bed sores

## 2013-05-10 NOTE — Progress Notes (Signed)
UR completed 

## 2013-05-11 DIAGNOSIS — L8993 Pressure ulcer of unspecified site, stage 3: Secondary | ICD-10-CM

## 2013-05-11 DIAGNOSIS — L089 Local infection of the skin and subcutaneous tissue, unspecified: Secondary | ICD-10-CM | POA: Diagnosis present

## 2013-05-11 DIAGNOSIS — N179 Acute kidney failure, unspecified: Secondary | ICD-10-CM

## 2013-05-11 DIAGNOSIS — L8990 Pressure ulcer of unspecified site, unspecified stage: Secondary | ICD-10-CM

## 2013-05-11 LAB — CBC
HEMATOCRIT: 27.9 % — AB (ref 39.0–52.0)
HEMOGLOBIN: 9.5 g/dL — AB (ref 13.0–17.0)
MCH: 29.1 pg (ref 26.0–34.0)
MCHC: 34.1 g/dL (ref 30.0–36.0)
MCV: 85.3 fL (ref 78.0–100.0)
Platelets: 146 10*3/uL — ABNORMAL LOW (ref 150–400)
RBC: 3.27 MIL/uL — AB (ref 4.22–5.81)
RDW: 16.1 % — ABNORMAL HIGH (ref 11.5–15.5)
WBC: 27.7 10*3/uL — ABNORMAL HIGH (ref 4.0–10.5)

## 2013-05-11 LAB — COMPREHENSIVE METABOLIC PANEL
ALT: 5 U/L (ref 0–53)
AST: 18 U/L (ref 0–37)
Albumin: 1.7 g/dL — ABNORMAL LOW (ref 3.5–5.2)
Alkaline Phosphatase: 66 U/L (ref 39–117)
BUN: 26 mg/dL — ABNORMAL HIGH (ref 6–23)
CALCIUM: 8 mg/dL — AB (ref 8.4–10.5)
CO2: 24 mEq/L (ref 19–32)
CREATININE: 1.45 mg/dL — AB (ref 0.50–1.35)
Chloride: 108 mEq/L (ref 96–112)
GFR calc Af Amer: 48 mL/min — ABNORMAL LOW (ref >90)
GFR calc non Af Amer: 41 mL/min — ABNORMAL LOW (ref >90)
GLUCOSE: 168 mg/dL — AB (ref 70–99)
Potassium: 4.6 mEq/L (ref 3.7–5.3)
SODIUM: 142 meq/L (ref 137–147)
TOTAL PROTEIN: 5 g/dL — AB (ref 6.0–8.3)
Total Bilirubin: 0.4 mg/dL (ref 0.3–1.2)

## 2013-05-11 MED ORDER — ENSURE PUDDING PO PUDG
1.0000 | Freq: Two times a day (BID) | ORAL | Status: DC
  Administered 2013-05-11: 1 via ORAL
  Filled 2013-05-11 (×2): qty 1

## 2013-05-11 MED ORDER — BIOTENE DRY MOUTH MT LIQD
15.0000 mL | Freq: Two times a day (BID) | OROMUCOSAL | Status: DC
  Administered 2013-05-11: 15 mL via OROMUCOSAL

## 2013-05-12 NOTE — Progress Notes (Signed)
Chaplain paged at 2244, 11 May 2013 by the desk staff of [facility] [address] [place].  Mr [name] died suddenly and unexpectedly. His daughter, [name], his primary care giver was called and responded that she was coming to the hospital. The family group arrived one hour later. They were in shock, the result of the uncertainty of the cause of of [name]'s death.   Family indicates that Mr [name] was a person of deeply held [place] faith. He is remembered as a fine man and one who loved his family genuinely and openly.   Family did not wish to be comforted by the chaplain and stated their community of faith would provide the comfort they needed. Yet, they were open to the spiritual care provided them by the chaplain and the staff.  The chaplain was present for a family meeting with the NP on shift. The meeting was open and allowed family to ask questions and share concerns. Again the focus of care was around the uncertainty of the cause of Mr. Vlachos's death and any possible links his CPR a year ago may have had on his death. Family was invited to call attending physicians to continue open dialogue and fact seeking.  After the family departed the chaplain and nurse had a inter-professional consult. This consult underscored our mutual observation that the family acted out of shock and grief.  It is likely that the family will seek further information from physicians in the near future.  Benjie Karvonenharles D. Ardath Lepak, APC, DMin Chaplain

## 2013-05-12 NOTE — Discharge Summary (Addendum)
Death Summary  [name]:[id number] DOB: 1922-11-12 DOA: 05-14-2013  PCP: Illene Regulus, MD PCP/Office notified: Via Epic  Admit date: 05-14-13 Date of Death: 05/16/13  Final Diagnoses:  Principal Problem:   Decubitus ulcer, stage 3 with infection Active Problems:   ANEMIA, IRON DEFICIENCY   GLAUCOMA   BLINDNESS, RIGHT EYE   Decubitus ulcer of hip, stage 2   Sacral decubitus ulcer, stage III   Severe protein-calorie malnutrition   Dehydration   Dementia   History of present illness:  [name] is a 80 y.o. male with a past medical history of glaucoma with the blindness in left eye. He also has a history of benign prostatic hypertrophy and, over the last year and a half has had dementia as well. Patient lives in his own house, but his daughter lives with him. She has other family members who help take care of the patient at home. In the last 3-4 months patient's mobility has declined significantly. Apparently, he has severe arthritis and he is unable to get up and do his any of his ADLs. He was not even able to get up into a wheelchair. Patient's daughter had been managing his decubitus ulcer with the help of home health. Patient has also had physical therapy on and off for the last couple years. She took him to see his primary care physician last month, and home health was again arranged. Home health nurse has been trying to take care of his wound. However, they felt that the wound has been getting worse over the last many days and recommended that he be brought into the hospital. There had been some bleeding from the wound. Some yellowish drainage. No history of any fever, nausea, vomiting. No diarrhea. He was incontinent of urine at times. He also has occasional stool incontinence. He was admitted back in March of 2014 with UTI and sepsis. When he was ready to be discharged he went into cardiac arrest. He was resuscitated. He did not require intubation. Did not require any  vasopressor support. It was felt that infection had contributed to the cardiac arrest. Subsequently patient was discharged home after he was stabilized. He was made a DO NOT RESUSCITATE.    Hospital Course:  Patient was admitted for management of infected sacral decubitus. He was placed on Vancomycin and Zosyn. As noted in my progress note from 2/17 he was doing about the same except for a remarkably high WBC without fever. All his other vital signs were stable. Wound care consult was obtained and they did not feel a surgical input was necessary. On the night on the 17th patient was noted to have no pulse or BP. He was already DNR per my earlier conversation with his daughter. Exact sequence of events is unknown. His death is indeed quite unexpected. I spoke to his daughter today and is quite upset. She is still thinking about an autopsy.  Cause of death remains unclear. He had a similar event last year when he had cardiac arrest but was successfully resuscitated without requiring airway support. At that time it was felt infection could have played a role. Patient was in declining health and could have had other underlying disease.  Other issues are as below:  Infected sacral decubitus stage III   Mild renal insufficiency and dehydration  He was on IVF. Renal function and Urine output was being monitored closely. Noted to be retaining urine and foley had to be placed.   Severe protein calorie  malnutrition and weight loss  Patient had ben slowly declining at home. Nutritional consult  wasplaced. PT and OT was ordered.   Possible history of dementia  This had not been formally diagnosed but the patient was exhibiting signs and symptoms of same. Daughter also felt that he's had significant memory impairment and cognitive impairment over last year.   History of glaucoma   Normocytic anemia  Hemoglobin was close to baseline.   Debility As discussed above  Considering his decline, patient was  considered a candidate for palliative care. Plan was to reevaluate this over the next few days depending on his progress.    Armentha Branagan  [facility] 2013/09/16, 7:13 PM

## 2013-05-23 NOTE — Progress Notes (Signed)
Patient found with no bp or respiration.  Confirmed by 2 RNS.  Nira RetortLeslie D Williams James RN and Birder RobsonMinnie DeBrew RN

## 2013-05-23 NOTE — Progress Notes (Signed)
OT Cancellation Note/Screen  Patient Details Name: Rojelio BrennerClary B Atwater MRN: [id number] DOB: Jul 12, 1922   Cancelled Treatment:    Reason Eval/Treat Not Completed: Other (comment).  Per notes, pt has been bedbound and needs total A with adls.  HHPT/OT have been involved with pt.  Will sign off in acute, but recommend HH continue to follow pt for mobility and family education.    Yalonda Sample 05/21/2013, 2:25 PM Marica OtterMaryellen Telissa Palmisano, OTR/L [phone] 05/22/2013

## 2013-05-23 NOTE — Progress Notes (Addendum)
[facility] PROGRESS NOTE  [name] ZOX:[id number] DOB: Apr 13, 1922 DOA: 05/20/2013  PCP: Illene Regulus, MD  Brief HPI: [name] is a 80 y.o. male with a past medical history of glaucoma with the blindness in left eye. He also has a history of benign prostatic hypertrophy and, over the last year and a half has had dementia as well. He was brought in due to worsening sacral decube along with infection.  Past medical history:  Past Medical History  Diagnosis Date  . Unspecified visual loss   . Blindness of right eye   . Anemia, iron deficiency   . Glaucoma   . Hearing decreased    Consultants: None  Procedures: None  Antibiotics: Vanc 2/16--> Zosyn 2/16-->  Subjective: Patient was noted to have urinary retention this morning and a foley had to be placed. He had some pain in lower abdomen as a result. Patient is pleasantly confused with dementia at baseline and unable to give accurate history.  Objective: Vital Signs  Filed Vitals:   05/21/2013 1930 04/27/2013 2027 05/22/2013 2033 13-May-2013 0450  BP: 116/52  128/57 146/90  Pulse: 71  97 74  Temp:   98.8 F (37.1 C) 97.6 F (36.4 C)  TempSrc:    Oral  Resp: 15  14 18   Height:  6' (1.829 m)    Weight:  60.3 kg (132 lb 15 oz)    SpO2: 97%  95% 99%    Intake/Output Summary (Last 24 hours) at May 13, 2013 0827 Last data filed at 2013-05-13 0519  Gross per 24 hour  Intake 1828.67 ml  Output      0 ml  Net 1828.67 ml   Filed Weights   05/03/2013 2027  Weight: 60.3 kg (132 lb 15 oz)    General appearance: alert, distracted and no distress Resp: clear to auscultation bilaterally Cardio: regular rate and rhythm, S1, S2 normal, no murmur, click, rub or gallop GI: soft, mild tenderness in suprapubic area without rebound rigidity or guarding. No masses or organomegaly. Extremities: extremities normal, atraumatic, no cyanosis or edema Skin: Decubitus ulcers as described in H&P. Not reexamined today. Neurologic: No  focal deficits. Generalized weakness appreciated.  Lab Results:  Basic Metabolic Panel:  Recent Labs Lab 05/17/2013 1340 2013-05-13 0440  NA 145 142  K 3.5* 4.6  CL 108 108  CO2 23 24  GLUCOSE 73 168*  BUN 24* 26*  CREATININE 1.44* 1.45*  CALCIUM 8.5 8.0*   Liver Function Tests:  Recent Labs Lab 05/21/2013 1340 2013/05/13 0440  AST 17 18  ALT <5 5  ALKPHOS 53 66  BILITOT 0.4 0.4  PROT 5.2* 5.0*  ALBUMIN 1.8* 1.7*   CBC:  Recent Labs Lab 05/07/2013 1250 May 13, 2013 0440  WBC 9.0 27.7*  NEUTROABS 7.1  --   HGB 9.7* 9.5*  HCT 28.3* 27.9*  MCV 85.0 85.3  PLT 167 146*     Studies/Results: Dg Pelvis Portable  05/10/2013   CLINICAL DATA:  Pain with hip movement.  No known injury.  EXAM: PORTABLE PELVIS 1-2 VIEWS  COMPARISON:  01/05/2008  FINDINGS: Patient is rotated to the right. Bones are diffusely demineralized. The SI joints are unremarkable. No evidence for pelvic fracture although a rotation limits assessment. Joint space in the hips is symmetric and relatively well preserved since the prior study. No worrisome lytic or sclerotic osseous abnormality.  IMPRESSION: No radiographic findings to explain the patient's history of pain.   Electronically Signed   By: Kennith Center  M.D.   On: 05/09/2013 16:35    Medications:  Scheduled: . antiseptic oral rinse  15 mL Mouth Rinse BID  . docusate sodium  100 mg Oral BID  . feeding supplement (ENSURE COMPLETE)  237 mL Oral TID BM  . finasteride  5 mg Oral Daily  . latanoprost  1 drop Left Eye QHS  . levobunolol  1 drop Left Eye Daily  . multivitamin with minerals  1 tablet Oral Daily  . piperacillin-tazobactam (ZOSYN)  IV  3.375 g Intravenous 3 times per day  . tamsulosin  0.4 mg Oral Daily  . vancomycin  750 mg Intravenous Q24H   Continuous: . sodium chloride 100 mL/hr at 05/15/2013 2106   ZOX:[id number]:acetaminophen, acetaminophen, morphine injection, ondansetron (ZOFRAN) IV, ondansetron, oxyCODONE  Assessment/Plan:  Principal  Problem:   Decubitus ulcer, stage 3 with infection Active Problems:   ANEMIA, IRON DEFICIENCY   GLAUCOMA   BLINDNESS, RIGHT EYE   Decubitus ulcer of hip, stage 2   Sacral decubitus ulcer, stage III   Severe protein-calorie malnutrition   Dehydration   Dementia    Infected sacral decubitus stage III Surprisingly WBC has increased significantly. He remains afebrile however. Repeat lab in AM. We await Wound care consult. Continue Vanc and Zosyn. Depending on wound care input we may have to involve surgery if more extensive debridement is required. His abdomen continues to be benign. Pelvic films did not show any concerning findings.  Mild renal insufficiency and dehydration Continue IVF. Monitor Renal function and Urine output. Noted to be retaining urine and foley had to be placed.  Severe protein calorie malnutrition and weight loss Patient appears to be slowly declining. Nutritional consult placed. He will be kept on ensure. PT and OT to see as well. Daughter understands that the patient is slowly declining.   Possible history of dementia This has not been formally diagnosed but the patient exhibits signs and symptoms of same. Daughter also feels, that he's had significant memory impairment and cognitive impairment over last year.   History of glaucoma Continue with his eye drops.   Normocytic anemia Hemoglobin appears to be close to baseline. Continue to monitor.   Considering his decline, patient could be a candidate for palliative care. We can reevaluate this over the next day or so depending on his progress.  DVT Prophylaxis: SCDs  Code Status: CODE STATUS discussed in detail with the patient's daughter. Patient is a DO NOT RESUSCITATE.  Family Communication: Discussed with [name] his daughter on 2/16. Disposition Plan: Daughter would like to take him back home when he is ready for discharge.    LOS: 1 day   [facility] Surgery CenterKRISHNAN,Hedda Crumbley  [facility] Pager [phone] 2013-04-03,  8:27 AM  If 8PM-8AM, please contact night-coverage at [email or web address], password [email or web address]

## 2013-05-23 NOTE — Progress Notes (Signed)
[name] patient daughter called to let her know that her father had passed.  [name] had questions regarding sequence of events.  This information was shared with [name], she stated she would be coming to the hospital.

## 2013-05-23 NOTE — Progress Notes (Signed)
INITIAL NUTRITION ASSESSMENT  DOCUMENTATION CODES Per approved criteria  -Severe malnutrition in the context of chronic illness -Underweight  Pt meets criteria for severe MALNUTRITION in the context of chronic illness as evidenced by severe muscle wasting and subcutaneous fat loss in multiple regions, and likely <75% PO intake for > one month.   INTERVENTION: -Continue to recommend Ensure Complete po TID, each supplement provides 350 kcal and 13 grams of protein -Recommend Ensure Pudding po BID, each supplement provides 170 kcal and 4 grams of protein -Will continue to monitor  NUTRITION DIAGNOSIS: Increased nutrient needs (protein/kcal) related to increased demand for nutrients as evidenced by  BMI <18.5, sacral and hip ulcers.   Goal: Pt to meet >/= 90% of their estimated nutrition needs    Monitor:  Total protein/energy intake, labs, weights, skin integrity  Reason for Assessment: Consult to Assess/MST/Underweight BMI  80 y.o. male  Admitting Dx: Decubitus ulcer, stage 3 with infection  ASSESSMENT: Paul BrennerClary B Mcbean is a 80 y.o. male with a past medical history of glaucoma with the blindness in left eye. He also has a history of benign prostatic hypertrophy and, over the last year and a half has had dementia as well. Patient lives in his own house, but his daughter lives with him. She has other family members who help take care of the patient at home. In the last 3-4 months patient's mobility has declined significantly. Apparently, he has severe arthritis and he is unable to get up and do his any of his ADLs. He is not even able to get up into a wheelchair. Patient's daughter has been managing his decubitus ulcer with the help of home health. Patient has also had physical therapy on and off for the last couple years. She took him to see his primary care physician last month, and home health was again arranged. Home health nurse has been trying to take care of his wound. However, they  feel that the wound has been getting worse over the last many days and recommended that he be brought into the hospital.   -Pt denied any changes in appetite -Pt able to provide limited diet recall. Pt reported eating 50% of 2 meals/day and consuming Ensure Complete BID -Pt denied any weight loss, appears to have stayed within 132-136 for past two years per previous medical records -Tolerating Dys 2 diet. Is missing one of front teeth, which makes its slightly difficulty to chew tougher meats. Pt reported they soften/chop up meats at home.  -Consumed 50% of breakfast per pt report. Had not drank Ensure during time of assessment. Was willing to incorporate additional supplements with meals -WOC consult reported pt with unstageable pressure ulcers on sacrum and right hip, and right IT w/recently reepithelialized pressure ulcer. Not a surgical candidate at this time Nutrition Focused Physical Exam:  Subcutaneous Fat:  Orbital Region: moderate Upper Arm Region: severe Thoracic and Lumbar Region: severe  Muscle:  Temple Region: moderate Clavicle Bone Region: severe Clavicle and Acromion Bone Region: severe Scapular Bone Region: severe Dorsal Hand: severe Patellar Region: severe Anterior Thigh Region: severe Posterior Calf Region: N/A-in dressing  Edema: n/a     Height: Ht Readings from Last 1 Encounters:  07-21-13 6' (1.829 m)    Weight: Wt Readings from Last 1 Encounters:  07-21-13 132 lb 15 oz (60.3 kg)    Ideal Body Weight: 178 lbs  % Ideal Body Weight: 74%  Wt Readings from Last 10 Encounters:  07-21-13 132 lb 15 oz (60.3 kg)  06/17/12 136 lb (61.689 kg)  03/23/12 135 lb 2.3 oz (61.3 kg)  04/27/10 155 lb (70.308 kg)  12/14/09 163 lb (73.936 kg)  02/14/09 165 lb (74.844 kg)  01/15/08 180 lb (81.647 kg)    Usual Body Weight: 132-135 per med records  % Usual Body Weight: 100%  BMI:  Body mass index is 18.03 kg/(m^2). underweight  Estimated Nutritional  Needs: Kcal: 1900-2100 Protein: 80-90 Fluid: >/=1900 ml/daily  Skin: unstageable pressure ulcer on sacrum, r.hip  Diet Order: Dysphagia2/thin  EDUCATION NEEDS: -No education needs identified at this time   Intake/Output Summary (Last 24 hours) at 04/25/2013 1201 Last data filed at 05/16/2013 1057  Gross per 24 hour  Intake 2248.67 ml  Output    700 ml  Net 1548.67 ml    Last BM: 2/15   Labs:   Recent Labs Lab 2013-05-21 1340 05/19/2013 0440  NA 145 142  K 3.5* 4.6  CL 108 108  CO2 23 24  BUN 24* 26*  CREATININE 1.44* 1.45*  CALCIUM 8.5 8.0*  GLUCOSE 73 168*    CBG (last 3)  No results found for this basename: GLUCAP,  in the last 72 hours  Scheduled Meds: . antiseptic oral rinse  15 mL Mouth Rinse BID  . docusate sodium  100 mg Oral BID  . feeding supplement (ENSURE COMPLETE)  237 mL Oral TID BM  . finasteride  5 mg Oral Daily  . latanoprost  1 drop Left Eye QHS  . levobunolol  1 drop Left Eye Daily  . multivitamin with minerals  1 tablet Oral Daily  . piperacillin-tazobactam (ZOSYN)  IV  3.375 g Intravenous 3 times per day  . tamsulosin  0.4 mg Oral Daily  . vancomycin  750 mg Intravenous Q24H    Continuous Infusions: . sodium chloride 100 mL/hr at May 21, 2013 2106    Past Medical History  Diagnosis Date  . Unspecified visual loss   . Blindness of right eye   . Anemia, iron deficiency   . Glaucoma   . Hearing decreased     Past Surgical History  Procedure Laterality Date  . Appendectomy  Age 52  . Cataract extraction      Left 1989, Right 1990    Lloyd Huger MS RD LDN Clinical Dietitian Pager:[phone]

## 2013-05-23 NOTE — Consult Note (Addendum)
WOC wound consult note Reason for Consult:Unstageable PrUs on sacrum and right hip, Right IT with recently reepithelialized Pressure Ulcer. Wound type:Pressure Pressure Ulcer POA: Yes Measurement:Sacral:  8cm x 7cm area with red periphery and with darkened center measuring 3cm x 2cm. Right hip: 9.5cm x 4cm area with red periphery with darkened center measuring 2.5cm x 2cm.  Right IT recently reepithelialized area measuring 4.5cm x 2cm. Wound bed:As decribed above. Drainage (amount, consistency, odor) Serous, malodorous drainage in small amounts from sacral and right hip ulcers. None from right IT. Periwound:intact, dry. Dressing procedure/placement/frequency:Pateint is on a therapeutic sleep surface with low air loss overlay for pressure redistribution. I have provided orders for an enzymatic debriding agent as patient does not appear to be a surgical candidate.  Additionally, I have asked for PT and hydrotherapy to see if that therapy can loosen the necrotic tissue and expedite the revealing of a wound base.  I have provided bilateral pressure redistribution heel boots to mitigate risk of ulceration in these areas. WOC nursing team will not follow routinely, but will remain available to this patient, the nursing, therapy and medical teams.  Please re-consult if needed. Thanks, Ladona MowLaurie Arush Gatliff, MSN, RN, GNP, [place], [facility]ON[facility] [phone])

## 2013-05-23 NOTE — Progress Notes (Signed)
Chaplain paged for family support.  Chaplain Lumpkin will be in to see patient family

## 2013-05-23 NOTE — Evaluation (Signed)
Physical Therapy Evaluation Patient Details Name: [name] Orth MRN: [id number] DOB: 1922-03-27 Today's Date: 11-16-13 Time: 1400-1409 PT Time Calculation (min): 9 min  PT Assessment / Plan / Recommendation History of Present Illness  80 yo male admitted from home with unstageable decubitus ulcers. Hx of glaucoma, R eye blindness, BPH, arthritis. Bedbound per chart.   Clinical Impression  Limited eval due to pt complaining of R side/LE pain. Moaning out with small amount of rolling towards L side. Daughter not present in room to provide any info. Per chart, pt is bedbound at baseline. May follow-up on trial basis to further assess mobility if able. Recommend HHPT follow up in home environment for education. During session also noted gargling sounds with pt's attempt to speak to therapist. Suctioned pt several times during session as well-may need speech/swallow evaluation.???    PT Assessment       Follow Up Recommendations  Home health PT;Supervision/Assistance - 24 hour    Does the patient have the potential to tolerate intense rehabilitation      Barriers to Discharge        Equipment Recommendations   (lift equipment, mattress overlay)    Recommendations for Other Services     Frequency      Precautions / Restrictions Precautions Precautions: Fall Precaution Comments: multiple decubitus ulcers Restrictions Weight Bearing Restrictions: No   Pertinent Vitals/Pain R side/LE-unrated. Pt able to point to painful area      Mobility  Bed Mobility Overal bed mobility: Needs Assistance Bed Mobility: Rolling Rolling: Total assist General bed mobility comments: Pt able to initiate reaching over towards L rail to roll but he moans out in pain with movement....and states "please don't hurt me".  Transfers General transfer comment: Did not attempt.     Exercises     PT Diagnosis:    PT Problem List:   PT Treatment Interventions:       PT Goals(Current goals can be  found in the care plan section) Acute Rehab PT Goals Patient Stated Goal: none stated. no family present PT Goal Formulation: Patient unable to participate in goal setting Time For Goal Achievement: 05/25/13 Potential to Achieve Goals: Poor  Visit Information  Last PT Received On: 10/05/13 Assistance Needed: +2 History of Present Illness: 80 yo male admitted from home with unstageable decubitus ulcers. Hx of glaucoma, R eye blindness, BPH, arthritis. Bedbound per chart.        Prior Functioning  Home Living Family/patient expects to be discharged to:: Private residence Living Arrangements: Children (daughter) Available Help at Discharge: Family Additional Comments: hx of dementia. Daughter not present to provide PLOF, home environment info Prior Function Comments: Again, pt poor historian and unable to fully obtain PLOF/history.     Cognition  Cognition Arousal/Alertness: Awake/alert Behavior During Therapy: WFL for tasks assessed/performed Overall Cognitive Status: No family/caregiver present to determine baseline cognitive functioning    Extremity/Trunk Assessment Upper Extremity Assessment Upper Extremity Assessment: Generalized weakness Lower Extremity Assessment Lower Extremity Assessment: Generalized weakness   Balance    End of Session PT - End of Session Activity Tolerance: Patient limited by pain Patient left: in bed;with call bell/phone within reach  GP     Rebeca AlertJannie Samauri Kellenberger, MPT Pager: [phone]

## 2013-05-23 DEATH — deceased

## 2014-01-07 IMAGING — CR DG CHEST 2V
2 series · 2 of 2 positions shown · non-contrast
Comparison: 03/24/2012

CLINICAL DATA: Shortness of breath, weakness

CHEST - 2 VIEW

[x chest ap]
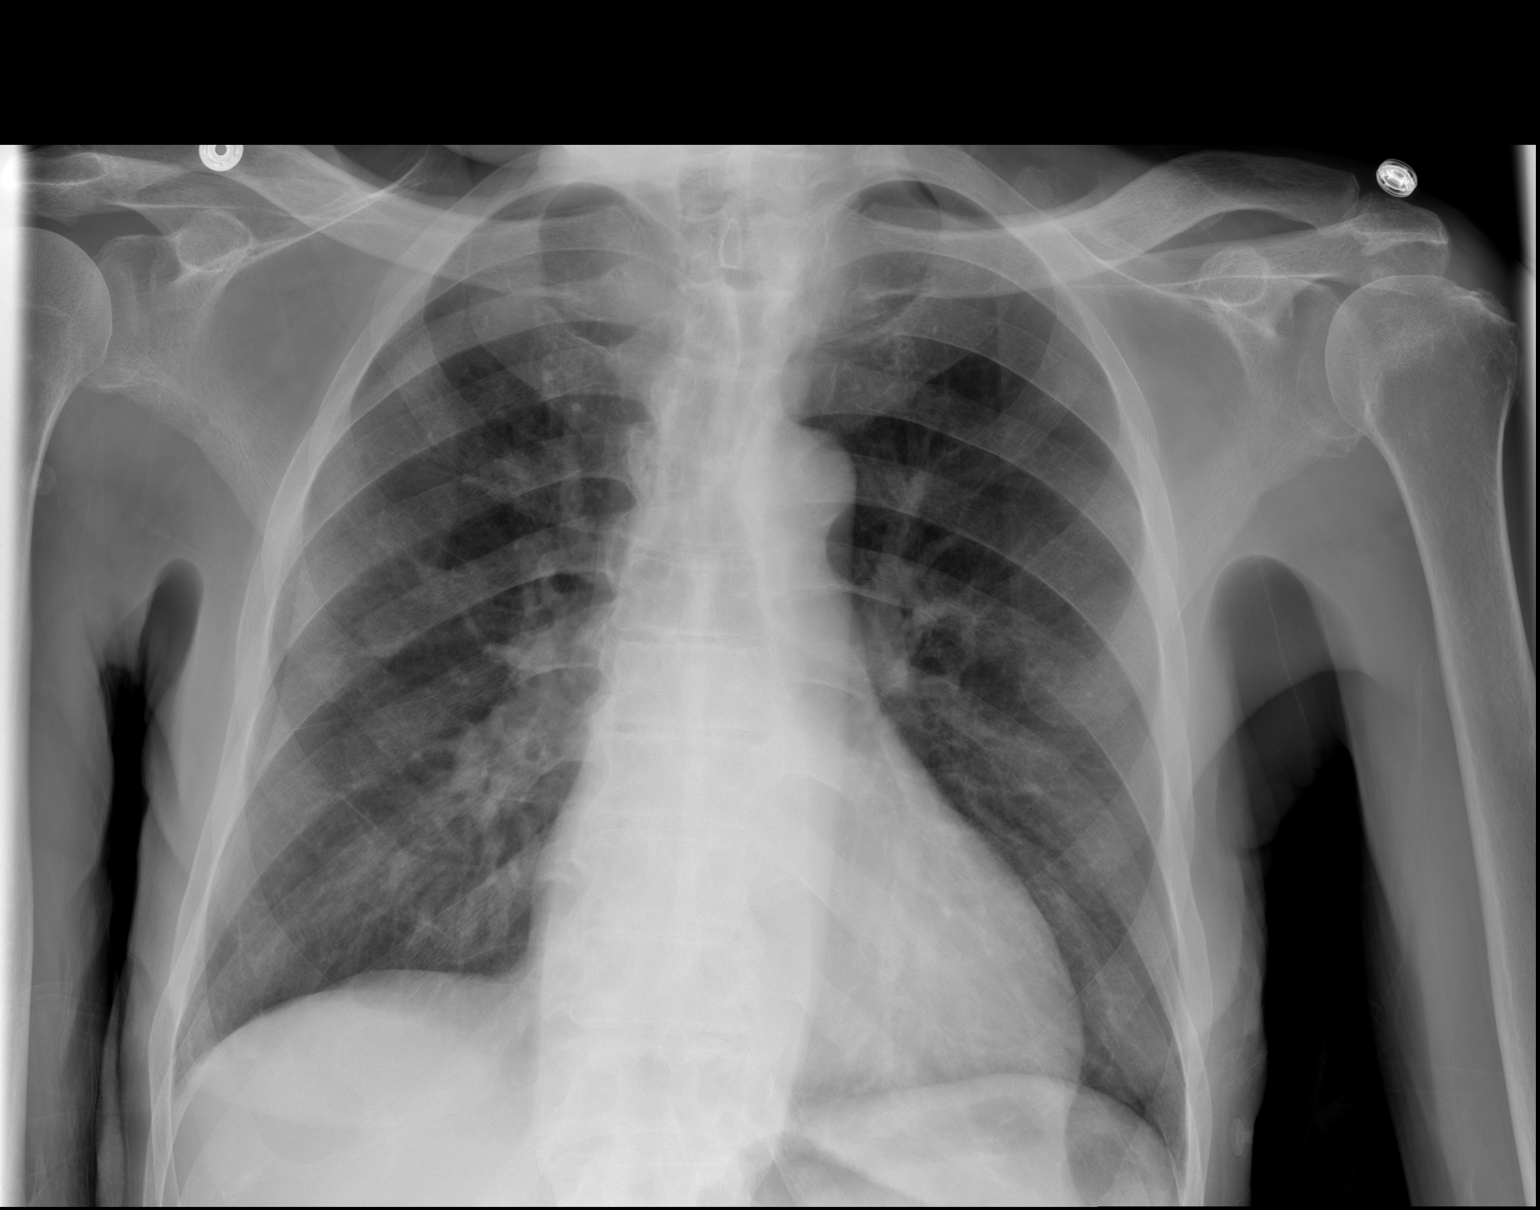

[w chest lat]
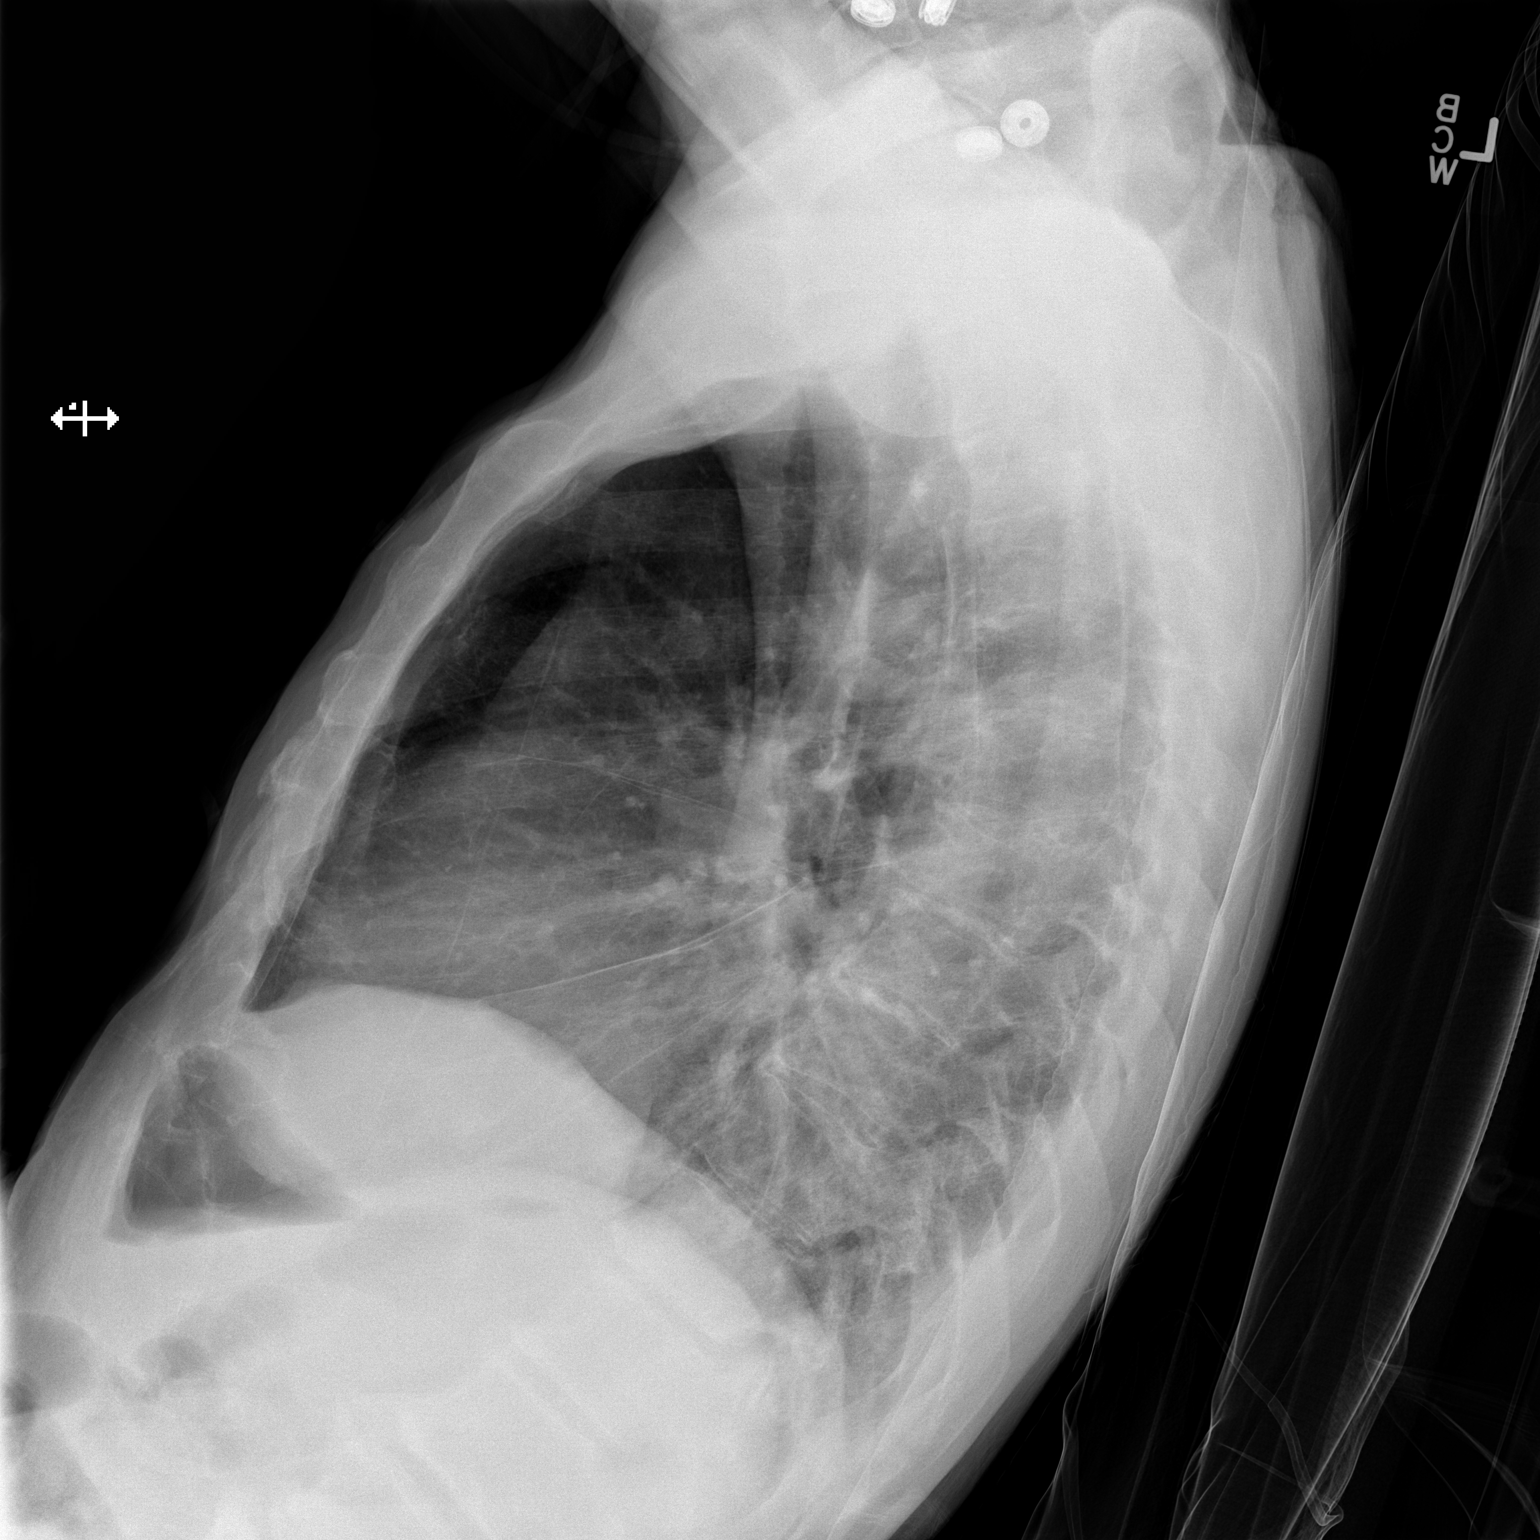

[2 of 2 positions shown; findings below may reference images not displayed]

FINDINGS: Upper normal heart size.
Mediastinal contours and pulmonary vascularity normal.
Skin fold projects over right chest.
Lungs hyperinflated but clear.
No pleural effusion or pneumothorax.
Inferior acromial spur formation at left shoulder.
Osseous demineralization.
IMPRESSION: Hyperinflated lungs without acute infiltrate.
Inferior left acromial spur formation which may predispose the
patient to rotator cuff pathology.
